# Patient Record
Sex: Female | Born: 1965
Health system: Southern US, Community
[De-identification: ages and names within clinical notes are randomized; demographics above are authoritative.]

## PROBLEM LIST (undated history)

## (undated) DIAGNOSIS — E119 Type 2 diabetes mellitus without complications: Secondary | ICD-10-CM

## (undated) DIAGNOSIS — F419 Anxiety disorder, unspecified: Secondary | ICD-10-CM

## (undated) DIAGNOSIS — I10 Essential (primary) hypertension: Secondary | ICD-10-CM

## (undated) DIAGNOSIS — K219 Gastro-esophageal reflux disease without esophagitis: Secondary | ICD-10-CM

## (undated) HISTORY — PX: OTHER SURGICAL HISTORY: SHX169

---

## 2015-08-15 ENCOUNTER — Encounter (HOSPITAL_COMMUNITY): Payer: Self-pay

## 2015-08-15 ENCOUNTER — Emergency Department (HOSPITAL_COMMUNITY): Payer: Medicaid Other

## 2015-08-15 DIAGNOSIS — F419 Anxiety disorder, unspecified: Secondary | ICD-10-CM | POA: Insufficient documentation

## 2015-08-15 DIAGNOSIS — K219 Gastro-esophageal reflux disease without esophagitis: Secondary | ICD-10-CM | POA: Diagnosis not present

## 2015-08-15 DIAGNOSIS — R0602 Shortness of breath: Secondary | ICD-10-CM | POA: Diagnosis not present

## 2015-08-15 DIAGNOSIS — E1165 Type 2 diabetes mellitus with hyperglycemia: Secondary | ICD-10-CM | POA: Insufficient documentation

## 2015-08-15 DIAGNOSIS — R079 Chest pain, unspecified: Principal | ICD-10-CM | POA: Insufficient documentation

## 2015-08-15 DIAGNOSIS — I10 Essential (primary) hypertension: Secondary | ICD-10-CM | POA: Insufficient documentation

## 2015-08-15 LAB — CBC
HCT: 31.8 % — ABNORMAL LOW (ref 36.0–46.0)
Hemoglobin: 10.6 g/dL — ABNORMAL LOW (ref 12.0–15.0)
MCH: 25.9 pg — ABNORMAL LOW (ref 26.0–34.0)
MCHC: 33.3 g/dL (ref 30.0–36.0)
MCV: 77.8 fL — ABNORMAL LOW (ref 78.0–100.0)
PLATELETS: 282 10*3/uL (ref 150–400)
RBC: 4.09 MIL/uL (ref 3.87–5.11)
RDW: 13.7 % (ref 11.5–15.5)
WBC: 6.4 10*3/uL (ref 4.0–10.5)

## 2015-08-15 LAB — I-STAT TROPONIN, ED: TROPONIN I, POC: 0 ng/mL (ref 0.00–0.08)

## 2015-08-15 NOTE — ED Notes (Signed)
Pt states she chest pain has been bothering her a couple days now and has been having SOB with it. It feels tight and also feels like heart burn. Pt is diabetic and goes to [facility]. Hasn't had meds in a couple weeks but usually goes to [facility] for meds.

## 2015-08-16 ENCOUNTER — Observation Stay (HOSPITAL_COMMUNITY)
Admission: EM | Admit: 2015-08-16 | Discharge: 2015-08-16 | Disposition: A | Discharge status: Home / Self Care | Payer: Medicaid Other | Attending: Emergency Medicine | Admitting: Emergency Medicine

## 2015-08-16 DIAGNOSIS — R739 Hyperglycemia, unspecified: Secondary | ICD-10-CM

## 2015-08-16 DIAGNOSIS — R079 Chest pain, unspecified: Secondary | ICD-10-CM

## 2015-08-16 DIAGNOSIS — R55 Syncope and collapse: Secondary | ICD-10-CM | POA: Diagnosis present

## 2015-08-16 HISTORY — DX: Type 2 diabetes mellitus without complications: E11.9

## 2015-08-16 HISTORY — DX: Gastro-esophageal reflux disease without esophagitis: K21.9

## 2015-08-16 HISTORY — DX: Anxiety disorder, unspecified: F41.9

## 2015-08-16 HISTORY — DX: Essential (primary) hypertension: I10

## 2015-08-16 LAB — BASIC METABOLIC PANEL
Anion gap: 10 (ref 5–15)
BUN: 16 mg/dL (ref 6–20)
CALCIUM: 9.4 mg/dL (ref 8.9–10.3)
CO2: 28 mmol/L (ref 22–32)
CREATININE: 1.22 mg/dL — AB (ref 0.44–1.00)
Chloride: 92 mmol/L — ABNORMAL LOW (ref 101–111)
GFR calc Af Amer: >60 mL/min (ref >60)
GFR, EST NON AFRICAN AMERICAN: 52 mL/min — AB (ref >60)
Glucose, Bld: 484 mg/dL — ABNORMAL HIGH (ref 65–99)
POTASSIUM: 4.5 mmol/L (ref 3.5–5.1)
SODIUM: 130 mmol/L — AB (ref 135–145)

## 2015-08-16 LAB — I-STAT TROPONIN, ED: Troponin i, poc: 0 ng/mL (ref 0.00–0.08)

## 2015-08-16 LAB — CBG MONITORING, ED: GLUCOSE-CAPILLARY: 311 mg/dL — AB (ref 65–99)

## 2015-08-16 MED ORDER — SERTRALINE HCL 50 MG PO TABS: 150.0000 mg | ORAL_TABLET | Freq: Every day | ORAL | Status: AC

## 2015-08-16 MED ORDER — SODIUM CHLORIDE 0.9 % IV BOLUS (SEPSIS)
1000.0000 mL | Freq: Once | INTRAVENOUS | Status: AC
Start: 2015-08-16 — End: 2015-08-16
  Administered 2015-08-16: 1000 mL via INTRAVENOUS

## 2015-08-16 MED ORDER — INSULIN ASPART 100 UNIT/ML ~~LOC~~ SOLN
10.0000 [IU] | Freq: Once | SUBCUTANEOUS | Status: AC
  Administered 2015-08-16: 10 [IU] via SUBCUTANEOUS
  Filled 2015-08-16: qty 1

## 2015-08-16 MED ORDER — LISINOPRIL-HYDROCHLOROTHIAZIDE 20-25 MG PO TABS: 1.0000 | ORAL_TABLET | Freq: Every day | ORAL | Status: DC

## 2015-08-16 MED ORDER — OMEPRAZOLE 20 MG PO CPDR: 20.0000 mg | DELAYED_RELEASE_CAPSULE | Freq: Every day | ORAL | Status: AC

## 2015-08-16 MED ORDER — METFORMIN HCL 500 MG PO TABS: 1000.0000 mg | ORAL_TABLET | Freq: Two times a day (BID) | ORAL | Status: DC

## 2015-08-16 NOTE — Discharge Instructions (Signed)
Nonspecific Chest Pain  Chest pain can be caused by many different conditions. There is always a chance that your pain could be related to something serious, such as a heart attack or a blood clot in your lungs. Chest pain can also be caused by conditions that are not life-threatening. If you have chest pain, it is very important to follow up with your health care provider. CAUSES  Chest pain can be caused by:  Heartburn.  Pneumonia or bronchitis.  Anxiety or stress.  Inflammation around your heart (pericarditis) or lung (pleuritis or pleurisy).  A blood clot in your lung.  A collapsed lung (pneumothorax). It can develop suddenly on its own (spontaneous pneumothorax) or from trauma to the chest.  Shingles infection (varicella-zoster virus).  Heart attack.  Damage to the bones, muscles, and cartilage that make up your chest wall. This can include:  Bruised bones due to injury.  Strained muscles or cartilage due to frequent or repeated coughing or overwork.  Fracture to one or more ribs.  Sore cartilage due to inflammation (costochondritis). RISK FACTORS  Risk factors for chest pain may include:  Activities that increase your risk for trauma or injury to your chest.  Respiratory infections or conditions that cause frequent coughing.  Medical conditions or overeating that can cause heartburn.  Heart disease or family history of heart disease.  Conditions or health behaviors that increase your risk of developing a blood clot.  Having had chicken pox (varicella zoster). SIGNS AND SYMPTOMS Chest pain can feel like:  Burning or tingling on the surface of your chest or deep in your chest.  Crushing, pressure, aching, or squeezing pain.  Dull or sharp pain that is worse when you move, cough, or take a deep breath.  Pain that is also felt in your back, neck, shoulder, or arm, or pain that spreads to any of these areas. Your chest pain may come and go, or it may stay  constant. DIAGNOSIS Lab tests or other studies may be needed to find the cause of your pain. Your health care provider may have you take a test called an ambulatory ECG (electrocardiogram). An ECG records your heartbeat patterns at the time the test is performed. You may also have other tests, such as:  Transthoracic echocardiogram (TTE). During echocardiography, sound waves are used to create a picture of all of the heart structures and to look at how blood flows through your heart.  Transesophageal echocardiogram (TEE).This is a more advanced imaging test that obtains images from inside your body. It allows your health care provider to see your heart in finer detail.  Cardiac monitoring. This allows your health care provider to monitor your heart rate and rhythm in real time.  Holter monitor. This is a portable device that records your heartbeat and can help to diagnose abnormal heartbeats. It allows your health care provider to track your heart activity for several days, if needed.  Stress tests. These can be done through exercise or by taking medicine that makes your heart beat more quickly.  Blood tests.  Imaging tests. TREATMENT  Your treatment depends on what is causing your chest pain. Treatment may include:  Medicines. These may include:  Acid blockers for heartburn.  Anti-inflammatory medicine.  Pain medicine for inflammatory conditions.  Antibiotic medicine, if an infection is present.  Medicines to dissolve blood clots.  Medicines to treat coronary artery disease.  Supportive care for conditions that do not require medicines. This may include:  Resting.  Applying heat  or cold packs to injured areas.  Limiting activities until pain decreases. HOME CARE INSTRUCTIONS  If you were prescribed an antibiotic medicine, finish it all even if you start to feel better.  Avoid any activities that bring on chest pain.  Do not use any tobacco products, including  cigarettes, chewing tobacco, or electronic cigarettes. If you need help quitting, ask your health care provider.  Do not drink alcohol.  Take medicines only as directed by your health care provider.  Keep all follow-up visits as directed by your health care provider. This is important. This includes any further testing if your chest pain does not go away.  If heartburn is the cause for your chest pain, you may be told to keep your head raised (elevated) while sleeping. This reduces the chance that acid will go from your stomach into your esophagus.  Make lifestyle changes as directed by your health care provider. These may include:  Getting regular exercise. Ask your health care provider to suggest some activities that are safe for you.  Eating a heart-healthy diet. A registered dietitian can help you to learn healthy eating options.  Maintaining a healthy weight.  Managing diabetes, if necessary.  Reducing stress. SEEK MEDICAL CARE IF:  Your chest pain does not go away after treatment.  You have a rash with blisters on your chest.  You have a fever. SEEK IMMEDIATE MEDICAL CARE IF:   Your chest pain is worse.  You have an increasing cough, or you cough up blood.  You have severe abdominal pain.  You have severe weakness.  You faint.  You have chills.  You have sudden, unexplained chest discomfort.  You have sudden, unexplained discomfort in your arms, back, neck, or jaw.  You have shortness of breath at any time.  You suddenly start to sweat, or your skin gets clammy.  You feel nauseous or you vomit.  You suddenly feel light-headed or dizzy.  Your heart begins to beat quickly, or it feels like it is skipping beats. These symptoms may represent a serious problem that is an emergency. Do not wait to see if the symptoms will go away. Get medical help right away. Call your local emergency services (911 in the [place] Do not drive yourself to the hospital.   This  information is not intended to replace advice given to you by your health care provider. Make sure you discuss any questions you have with your health care provider.   Document Released: 04/15/2005 Document Revised: 07/27/2014 Document Reviewed: 02/09/2014 Elsevier Interactive Patient Education 2016 [employer].  Hyperglycemia Hyperglycemia occurs when the glucose (sugar) in your blood is too high. Hyperglycemia can happen for many reasons, but it most often happens to people who do not know they have diabetes or are not managing their diabetes properly.  CAUSES  Whether you have diabetes or not, there are other causes of hyperglycemia. Hyperglycemia can occur when you have diabetes, but it can also occur in other situations that you might not be as aware of, such as: Diabetes  If you have diabetes and are having problems controlling your blood glucose, hyperglycemia could occur because of some of the following reasons:  Not following your meal plan.  Not taking your diabetes medications or not taking it properly.  Exercising less or doing less activity than you normally do.  Being sick. Pre-diabetes  This cannot be ignored. Before people develop Type 2 diabetes, they almost always have "pre-diabetes." This is when your blood glucose levels  are higher than normal, but not yet high enough to be diagnosed as diabetes. Research has shown that some long-term damage to the body, especially the heart and circulatory system, may already be occurring during pre-diabetes. If you take action to manage your blood glucose when you have pre-diabetes, you may delay or prevent Type 2 diabetes from developing. Stress  If you have diabetes, you may be "diet" controlled or on oral medications or insulin to control your diabetes. However, you may find that your blood glucose is higher than usual in the hospital whether you have diabetes or not. This is often referred to as "stress hyperglycemia." Stress can  elevate your blood glucose. This happens because of hormones put out by the body during times of stress. If stress has been the cause of your high blood glucose, it can be followed regularly by your caregiver. That way he/she can make sure your hyperglycemia does not continue to get worse or progress to diabetes. Steroids  Steroids are medications that act on the infection fighting system (immune system) to block inflammation or infection. One side effect can be a rise in blood glucose. Most people can produce enough extra insulin to allow for this rise, but for those who cannot, steroids make blood glucose levels go even higher. It is not unusual for steroid treatments to "uncover" diabetes that is developing. It is not always possible to determine if the hyperglycemia will go away after the steroids are stopped. A special blood test called an A1c is sometimes done to determine if your blood glucose was elevated before the steroids were started. SYMPTOMS  Thirsty.  Frequent urination.  Dry mouth.  Blurred vision.  Tired or fatigue.  Weakness.  Sleepy.  Tingling in feet or leg. DIAGNOSIS  Diagnosis is made by monitoring blood glucose in one or all of the following ways:  A1c test. This is a chemical found in your blood.  Fingerstick blood glucose monitoring.  Laboratory results. TREATMENT  First, knowing the cause of the hyperglycemia is important before the hyperglycemia can be treated. Treatment may include, but is not be limited to:  Education.  Change or adjustment in medications.  Change or adjustment in meal plan.  Treatment for an illness, infection, etc.  More frequent blood glucose monitoring.  Change in exercise plan.  Decreasing or stopping steroids.  Lifestyle changes. HOME CARE INSTRUCTIONS   Test your blood glucose as directed.  Exercise regularly. Your caregiver will give you instructions about exercise. Pre-diabetes or diabetes which comes on with  stress is helped by exercising.  Eat wholesome, balanced meals. Eat often and at regular, fixed times. Your caregiver or nutritionist will give you a meal plan to guide your sugar intake.  Being at an ideal weight is important. If needed, losing as little as 10 to 15 pounds may help improve blood glucose levels. SEEK MEDICAL CARE IF:   You have questions about medicine, activity, or diet.  You continue to have symptoms (problems such as increased thirst, urination, or weight gain). SEEK IMMEDIATE MEDICAL CARE IF:   You are vomiting or have diarrhea.  Your breath smells fruity.  You are breathing faster or slower.  You are very sleepy or incoherent.  You have numbness, tingling, or pain in your feet or hands.  You have chest pain.  Your symptoms get worse even though you have been following your caregiver's orders.  If you have any other questions or concerns.   This information is not intended to replace  advice given to you by your health care provider. Make sure you discuss any questions you have with your health care provider.   Document Released: 12/30/2000 Document Revised: 09/28/2011 Document Reviewed: 03/12/2015 Elsevier Interactive Patient Education [employer].

## 2015-08-16 NOTE — ED Provider Notes (Signed)
CSN: [id number]     Arrival date & time 08/15/15  2317 History   By signing my name below, I, Michele Shaw, attest that this documentation has been prepared under the direction and in the presence of Michele Greek, MD . Electronically Signed: Evelene Shaw, Scribe. 08/16/2015. 1:48 AM.    Chief Complaint  Patient presents with  . Chest Pain    The history is provided by the patient. No language interpreter was used.   HPI Comments:  Michele Shaw is a 50 y.o. female with a history of HTN, DM, GERD, and anxiety, who presents to the Emergency Department complaining of intermittent CP x a few days. She notes the episodes last for ~ 15 minutes before resolving. She reports associated SOB. She also notes left shoulder pain x months which is worse a night. Pt states she has been very stressed recently. No alleviating factors noted. She also notes she has been out of DM and HTN meds x 2 weeks.   Past Medical History  Diagnosis Date  . Hypertension   . Diabetes mellitus without complication ([place])   . Anxiety   . GERD (gastroesophageal reflux disease)    Past Surgical History  Procedure Laterality Date  . Cesarean section     No family history on file. Social History  Substance Use Topics  . Smoking status: Never Smoker   . Smokeless tobacco: None  . Alcohol Use: No   OB History    No data available     Review of Systems  Constitutional: Negative for fever.  Respiratory: Positive for shortness of breath.   Cardiovascular: Positive for chest pain.  Musculoskeletal: Positive for myalgias and arthralgias.       Left shoulder   All other systems reviewed and are negative.   Allergies  Aspirin  Home Medications   Prior to Admission medications   Not on File   BP 152/88 mmHg  Pulse 74  Temp(Src) 97.4 F (36.3 C) (Oral)  Resp 18  Ht 5\' 7"  (1.702 m)  Wt 180 lb (81.647 kg)  BMI 28.19 kg/m2  SpO2 98%  LMP 06/24/2015 Physical Exam  Constitutional: She is  oriented to person, place, and time. She appears well-developed and well-nourished. No distress.  HENT:  Head: Normocephalic and atraumatic.  Right Ear: Hearing normal.  Left Ear: Hearing normal.  Nose: Nose normal.  Mouth/Throat: Oropharynx is clear and moist and mucous membranes are normal.  Eyes: Conjunctivae and EOM are normal. Pupils are equal, round, and reactive to light.  Neck: Normal range of motion. Neck supple.  Cardiovascular: Regular rhythm, S1 normal and S2 normal.  Exam reveals no gallop and no friction rub.   No murmur heard. Pulmonary/Chest: Effort normal and breath sounds normal. No respiratory distress. She exhibits no tenderness.  Abdominal: Soft. Normal appearance and bowel sounds are normal. There is no hepatosplenomegaly. There is no tenderness. There is no rebound, no guarding, no tenderness at McBurney's point and negative Murphy's sign. No hernia.  Musculoskeletal: Normal range of motion.  Tenderness to left anterior shoulder; painful ROM  Neurological: She is alert and oriented to person, place, and time. She has normal strength. No cranial nerve deficit or sensory deficit. Coordination normal. GCS eye subscore is 4. GCS verbal subscore is 5. GCS motor subscore is 6.  Skin: Skin is warm, dry and intact. No rash noted. No cyanosis.  Psychiatric: She has a normal mood and affect. Her speech is normal and behavior is normal. Thought content normal.  Nursing note and vitals reviewed.   ED Course  Procedures   DIAGNOSTIC STUDIES:  Oxygen Saturation is 98% on RA, normal by my interpretation.    COORDINATION OF CARE:  1:43 AM Pt updated with partial results. Will order another trop, IV fluids and discharge with medication refills.  Discussed treatment plan with pt at bedside and pt agreed to plan.  Labs Review Labs Reviewed  BASIC METABOLIC PANEL - Abnormal; Notable for the following:    Sodium 130 (*)    Chloride 92 (*)    Glucose, Bld 484 (*)    Creatinine,  Ser 1.22 (*)    GFR calc non Af Amer 52 (*)    All other components within normal limits  CBC - Abnormal; Notable for the following:    Hemoglobin 10.6 (*)    HCT 31.8 (*)    MCV 77.8 (*)    MCH 25.9 (*)    All other components within normal limits  I-STAT TROPOININ, ED  CBG MONITORING, ED    Imaging Review Dg Chest 2 View  08/15/2015  CLINICAL DATA:  Acute onset of midline chest pain for 3 days, and chronic left arm pain. Initial encounter. EXAM: CHEST  2 VIEW COMPARISON:  None. FINDINGS: The lungs are well-aerated and clear. There is no evidence of focal opacification, pleural effusion or pneumothorax. The heart is normal in size; the mediastinal contour is within normal limits. No acute osseous abnormalities are seen. IMPRESSION: No acute cardiopulmonary process seen. Electronically Signed   By: Garald Balding M.D.   On: 08/15/2015 23:44   I have personally reviewed and evaluated these images and lab results as part of my medical decision-making.   EKG Interpretation   Date/Time:  Thursday August 15 2015 23:27:26 EST Ventricular Rate:  74 PR Interval:  150 QRS Duration: 82 QT Interval:  406 QTC Calculation: 450 R Axis:   -20 Text Interpretation:  Normal sinus rhythm Normal ECG Confirmed by POLLINA   MD, CHRISTOPHER [id number]) on 08/16/2015 1:37:56 AM      MDM   Final diagnoses:  None  chest pain hyperglycemia  Presents to the emergency department for evaluation of chest pain. She has been experiencing pain in the center of her chest for the last couple of days. She is occasionally short of breath with it. She thinks that it feels like heartburn, but she has a history of hypertension and diabetes and has been out of her medications for 2 weeks. Patient is not hypoxic, not tachycardic. PERC negative. She has chronic risk factors including hypertension and diabetes, is not a smoker. No family history. Initial EKG was normal. Troponin negative. Patient monitored in the ER and a  second troponin 3 hours after the first was still negative. It is felt that she is low risk and does not require hospitalization for further cardiac evaluation, can follow-up as an outpatient. Blood sugar was treated with a combination of IV fluids and insulin. Her medications will be refilled.  I personally performed the services described in this documentation, which was scribed in my presence. The recorded information has been reviewed and is accurate.    Michele Greek, MD 08/16/15 [phone]

## 2015-08-17 ENCOUNTER — Encounter (HOSPITAL_COMMUNITY): Payer: Self-pay | Admitting: *Deleted

## 2015-08-17 ENCOUNTER — Emergency Department (HOSPITAL_COMMUNITY)
Admission: EM | Admit: 2015-08-17 | Discharge: 2015-08-18 | Disposition: A | Discharge status: Home / Self Care | Payer: Medicaid Other | Attending: Emergency Medicine | Admitting: Emergency Medicine

## 2015-08-17 DIAGNOSIS — I10 Essential (primary) hypertension: Secondary | ICD-10-CM | POA: Diagnosis not present

## 2015-08-17 DIAGNOSIS — Z7984 Long term (current) use of oral hypoglycemic drugs: Secondary | ICD-10-CM | POA: Insufficient documentation

## 2015-08-17 DIAGNOSIS — Z79899 Other long term (current) drug therapy: Secondary | ICD-10-CM | POA: Diagnosis not present

## 2015-08-17 DIAGNOSIS — B349 Viral infection, unspecified: Secondary | ICD-10-CM

## 2015-08-17 DIAGNOSIS — F419 Anxiety disorder, unspecified: Secondary | ICD-10-CM | POA: Insufficient documentation

## 2015-08-17 DIAGNOSIS — R52 Pain, unspecified: Secondary | ICD-10-CM | POA: Diagnosis present

## 2015-08-17 DIAGNOSIS — R739 Hyperglycemia, unspecified: Secondary | ICD-10-CM

## 2015-08-17 DIAGNOSIS — K219 Gastro-esophageal reflux disease without esophagitis: Secondary | ICD-10-CM | POA: Insufficient documentation

## 2015-08-17 DIAGNOSIS — E1165 Type 2 diabetes mellitus with hyperglycemia: Secondary | ICD-10-CM | POA: Diagnosis not present

## 2015-08-17 LAB — URINALYSIS, ROUTINE W REFLEX MICROSCOPIC
Bilirubin Urine: NEGATIVE
Hgb urine dipstick: NEGATIVE
Ketones, ur: NEGATIVE mg/dL
LEUKOCYTES UA: NEGATIVE
NITRITE: NEGATIVE
PH: 5 (ref 5.0–8.0)
Protein, ur: NEGATIVE mg/dL
SPECIFIC GRAVITY, URINE: 1.027 (ref 1.005–1.030)

## 2015-08-17 LAB — I-STAT CHEM 8, ED
BUN: 13 mg/dL (ref 6–20)
CREATININE: 0.8 mg/dL (ref 0.44–1.00)
Calcium, Ion: 1.14 mmol/L (ref 1.12–1.23)
Chloride: 97 mmol/L — ABNORMAL LOW (ref 101–111)
GLUCOSE: 345 mg/dL — AB (ref 65–99)
HEMATOCRIT: 33 % — AB (ref 36.0–46.0)
HEMOGLOBIN: 11.2 g/dL — AB (ref 12.0–15.0)
POTASSIUM: 3.8 mmol/L (ref 3.5–5.1)
Sodium: 135 mmol/L (ref 135–145)
TCO2: 24 mmol/L (ref 0–100)

## 2015-08-17 LAB — CBC WITH DIFFERENTIAL/PLATELET
BASOS PCT: 0 %
Basophils Absolute: 0 10*3/uL (ref 0.0–0.1)
Eosinophils Absolute: 0.2 10*3/uL (ref 0.0–0.7)
Eosinophils Relative: 3 %
HEMATOCRIT: 29.4 % — AB (ref 36.0–46.0)
HEMOGLOBIN: 9.7 g/dL — AB (ref 12.0–15.0)
LYMPHS PCT: 36 %
Lymphs Abs: 2.3 10*3/uL (ref 0.7–4.0)
MCH: 25.7 pg — ABNORMAL LOW (ref 26.0–34.0)
MCHC: 33 g/dL (ref 30.0–36.0)
MCV: 78 fL (ref 78.0–100.0)
MONO ABS: 0.4 10*3/uL (ref 0.1–1.0)
MONOS PCT: 6 %
NEUTROS ABS: 3.5 10*3/uL (ref 1.7–7.7)
NEUTROS PCT: 55 %
Platelets: 250 10*3/uL (ref 150–400)
RBC: 3.77 MIL/uL — ABNORMAL LOW (ref 3.87–5.11)
RDW: 13.8 % (ref 11.5–15.5)
WBC: 6.4 10*3/uL (ref 4.0–10.5)

## 2015-08-17 LAB — URINE MICROSCOPIC-ADD ON

## 2015-08-17 MED ORDER — ACETAMINOPHEN 325 MG PO TABS
650.0000 mg | ORAL_TABLET | Freq: Once | ORAL | Status: AC
  Administered 2015-08-17: 650 mg via ORAL
  Filled 2015-08-17: qty 2

## 2015-08-17 NOTE — ED Provider Notes (Signed)
CSN: [id number]     Arrival date & time 08/17/15  2217 History   First MD Initiated Contact with Patient 08/17/15 2242     Chief Complaint  Patient presents with  . Generalized Body Aches     (Consider location/radiation/quality/duration/timing/severity/associated sxs/prior Treatment) HPI Comments: This 50 year old female who was seen 2 days ago for chest discomfort that she had for 2-3 days.  She states she was discharge home is also noted that she had slight hypertension and elevated blood sugar.  She states she is known hypertensive and diabetic, but had been without medication for several months due to funding issues.  She has no primary care physician locally, although she's been in [place] for the past 7 months.  She returns to the emergency room tonight complaining of myalgias continued chest discomfort.  She states she vomited one time yesterday.  She still feels slightly, nauseated without any episodes of vomiting.  Reports chills but no known fever  The history is provided by the patient.    Past Medical History  Diagnosis Date  . Hypertension   . Diabetes mellitus without complication ([place])   . Anxiety   . GERD (gastroesophageal reflux disease)    Past Surgical History  Procedure Laterality Date  . Cesarean section     No family history on file. Social History  Substance Use Topics  . Smoking status: Never Smoker   . Smokeless tobacco: None  . Alcohol Use: No   OB History    No data available     Review of Systems  Constitutional: Positive for chills. Negative for fever.  Respiratory: Negative for shortness of breath.   Cardiovascular: Positive for chest pain.  Gastrointestinal: Positive for vomiting and abdominal pain. Negative for diarrhea and constipation.  Genitourinary: Negative for dysuria.  Musculoskeletal: Positive for myalgias.  Skin: Negative for pallor and rash.  All other systems reviewed and are negative.     Allergies  Aspirin  Home  Medications   Prior to Admission medications   Medication Sig Start Date End Date Taking? Authorizing Provider  acetaminophen (TYLENOL) 500 MG tablet Take 1,000 mg by mouth every 6 (six) hours as needed for headache.   Yes Historical Provider, MD  lisinopril-hydrochlorothiazide (PRINZIDE,ZESTORETIC) 20-25 MG tablet Take 1 tablet by mouth daily. 08/16/15  Yes [name], MD  metFORMIN (GLUCOPHAGE) 500 MG tablet Take 2 tablets (1,000 mg total) by mouth 2 (two) times daily with a meal. 08/16/15  Yes [name], MD  omeprazole (PRILOSEC) 20 MG capsule Take 1 capsule (20 mg total) by mouth daily. 08/16/15  Yes [name], MD  sertraline (ZOLOFT) 50 MG tablet Take 3 tablets (150 mg total) by mouth daily. 08/16/15  Yes [name], MD   BP 135/78 mmHg  Pulse 75  Temp(Src) 98.5 F (36.9 C) (Oral)  Resp 18  Wt 81.784 kg  SpO2 100%  LMP 06/24/2015 Physical Exam  Constitutional: She is oriented to person, place, and time. She appears well-developed and well-nourished. No distress.  HENT:  Head: Normocephalic.  Eyes: Pupils are equal, round, and reactive to light.  Neck: Normal range of motion.  Cardiovascular: Normal rate and regular rhythm.   Pulmonary/Chest: Effort normal and breath sounds normal.  Abdominal: Soft. She exhibits no distension. There is no tenderness.  Musculoskeletal: Normal range of motion. She exhibits no edema or tenderness.  Neurological: She is alert and oriented to person, place, and time.  Skin: She is not diaphoretic.  Nursing note  and vitals reviewed.   ED Course  Procedures (including critical care time) Labs Review Labs Reviewed  URINALYSIS, ROUTINE W REFLEX MICROSCOPIC (NOT AT [facility]) - Abnormal; Notable for the following:    Glucose, UA >1000 (*)    All other components within normal limits  CBC WITH DIFFERENTIAL/PLATELET - Abnormal; Notable for the following:    RBC 3.77 (*)    Hemoglobin 9.7 (*)    HCT 29.4 (*)     MCH 25.7 (*)    All other components within normal limits  URINE MICROSCOPIC-ADD ON - Abnormal; Notable for the following:    Squamous Epithelial / LPF 0-5 (*)    Bacteria, UA RARE (*)    All other components within normal limits  I-STAT CHEM 8, ED - Abnormal; Notable for the following:    Chloride 97 (*)    Glucose, Bld 345 (*)    Hemoglobin 11.2 (*)    HCT 33.0 (*)    All other components within normal limits    Imaging Review No results found. I have personally reviewed and evaluated these images and lab results as part of my medical decision-making.   EKG Interpretation None     Patient states she took her dose of Metrofmin yesterday morning which was the first dose .Will give additional dose prior to DC Stressed improtant of PCP as well as regular medication monitoring her blood sugars etc.  MDM   Final diagnoses:  Viral illness  Hyperglycemia         Junius Creamer, NP 08/18/15 [place], MD 08/18/15 [phone]

## 2015-08-17 NOTE — ED Notes (Signed)
The pt has had generalized body aches  For 2-3 days.  Vomited yesterday.  lmp dec 6th

## 2015-08-18 MED ORDER — METFORMIN HCL 500 MG PO TABS
500.0000 mg | ORAL_TABLET | Freq: Once | ORAL | Status: AC
  Administered 2015-08-18: 500 mg via ORAL
  Filled 2015-08-18: qty 1

## 2015-08-18 NOTE — Discharge Instructions (Signed)
Blood Glucose Monitoring, Adult °Monitoring your blood glucose (also know as blood sugar) helps you to manage your diabetes. It also helps you and your health care provider monitor your diabetes and determine how well your treatment plan is working. °WHY SHOULD YOU MONITOR YOUR BLOOD GLUCOSE? °· It can help you understand how food, exercise, and medicine affect your blood glucose. °· It allows you to know what your blood glucose is at any given moment. You can quickly tell if you are having low blood glucose (hypoglycemia) or high blood glucose (hyperglycemia). °· It can help you and your health care provider know how to adjust your medicines. °· It can help you understand how to manage an illness or adjust medicine for exercise. °WHEN SHOULD YOU TEST? °Your health care provider will help you decide how often you should check your blood glucose. This may depend on the type of diabetes you have, your diabetes control, or the types of medicines you are taking. Be sure to write down all of your blood glucose readings so that this information can be reviewed with your health care provider. See below for examples of testing times that your health care provider may suggest. °Type 1 Diabetes °· Test at least 2 times per day if your diabetes is well controlled, if you are using an insulin pump, or if you perform multiple daily injections. °· If your diabetes is not well controlled or if you are sick, you may need to test more often. °· It is a good idea to also test: °¨ Before every insulin injection. °¨ Before and after exercise. °¨ Between meals and 2 hours after a meal. °¨ Occasionally between 2:00 a.m. and 3:00 a.m. °Type 2 Diabetes °· If you are taking insulin, test at least 2 times per day. However, it is best to test before every insulin injection. °· If you take medicines by mouth (orally), test 2 times a day. °· If you are on a controlled diet, test once a day. °· If your diabetes is not well controlled or if you  are sick, you may need to monitor more often. °HOW TO MONITOR YOUR BLOOD GLUCOSE °Supplies Needed °· Blood glucose meter. °· Test strips for your meter. Each meter has its own strips. You must use the strips that go with your own meter. °· A pricking needle (lancet). °· A device that holds the lancet (lancing device). °· A journal or log book to write down your results. °Procedure °· Wash your hands with soap and water. Alcohol is not preferred. °· Prick the side of your finger (not the tip) with the lancet. °· Gently milk the finger until a small drop of blood appears. °· Follow the instructions that come with your meter for inserting the test strip, applying blood to the strip, and using your blood glucose meter. °Other Areas to Get Blood for Testing °Some meters allow you to use other areas of your body (other than your finger) to test your blood. These areas are called alternative sites. The most common alternative sites are: °· The forearm. °· The thigh. °· The back area of the lower leg. °· The palm of the hand. °The blood flow in these areas is slower. Therefore, the blood glucose values you get may be delayed, and the numbers are different from what you would get from your fingers. Do not use alternative sites if you think you are having hypoglycemia. Your reading will not be accurate. Always use a finger if you are   having hypoglycemia. Also, if you cannot feel your lows (hypoglycemia unawareness), always use your fingers for your blood glucose checks. ADDITIONAL TIPS FOR GLUCOSE MONITORING  Do not reuse lancets.  Always carry your supplies with you.  All blood glucose meters have a 24-hour "hotline" number to call if you have questions or need help.  Adjust (calibrate) your blood glucose meter with a control solution after finishing a few boxes of strips. BLOOD GLUCOSE RECORD KEEPING It is a good idea to keep a daily record or log of your blood glucose readings. Most glucose meters, if not all,  keep your glucose records stored in the meter. Some meters come with the ability to download your records to your home computer. Keeping a record of your blood glucose readings is especially helpful if you are wanting to look for patterns. Make notes to go along with the blood glucose readings because you might forget what happened at that exact time. Keeping good records helps you and your health care provider to work together to achieve good diabetes management.    This information is not intended to replace advice given to you by your health care provider. Make sure you discuss any questions you have with your health care provider.   Document Released: 07/09/2003 Document Revised: 07/27/2014 Document Reviewed: 11/28/2012 Elsevier Interactive Patient Education 2016 [employer].  Emergency Department Resource Guide 1) Find a Doctor and Pay Out of Pocket Although you won't have to find out who is covered by your insurance plan, it is a good idea to ask around and get recommendations. You will then need to call the office and see if the doctor you have chosen will accept you as a new patient and what types of options they offer for patients who are self-pay. Some doctors offer discounts or will set up payment plans for their patients who do not have insurance, but you will need to ask so you aren't surprised when you get to your appointment.  2) Contact Your Local Health Department Not all health departments have doctors that can see patients for sick visits, but many do, so it is worth a call to see if yours does. If you don't know where your local health department is, you can check in your phone book. The CDC also has a tool to help you locate your state's health department, and many state websites also have listings of all of their local health departments.  3) Find a [facility] If your illness is not likely to be very severe or complicated, you may want to try a walk in clinic. These are  popping up all over the country in pharmacies, drugstores, and shopping centers. They're usually staffed by nurse practitioners or physician assistants that have been trained to treat common illnesses and complaints. They're usually fairly quick and inexpensive. However, if you have serious medical issues or chronic medical problems, these are probably not your best option.  No Primary Care Doctor: - Call Health Connect at  [phone] - they can help you locate a primary care doctor that  accepts your insurance, provides certain services, etc. - Physician Referral Service- [phone]  Chronic Pain Problems: Organization         Address  Phone   Notes  [facility]  [phone] Patients need to be referred by their primary care doctor.   Medication Assistance: Organization         Address  Phone   Notes  [facility] Medication  Assistance Program [address] [place], [place] [address] [phone] --Must be a resident of [facility] -- Must have NO insurance coverage whatsoever (no Medicaid/ Medicare, etc.) -- The pt. MUST have a primary care doctor that directs their care regularly and follows them in the community   [facility]ist  [phone]   [employer]  [phone]    Agencies that provide inexpensive medical care: Organization         Address  Phone   Notes  [place]  [phone]   [facility]    [phone]   [facility] [place], [place] [address] [phone]   [place] [address] [place] [phone]   Planned Parenthood    ([phone]   [facility]    [phone]   [place] and [place] [address], [place] Phone:  [phone], Fax:  [phone] Hours of Operation:  9 am - 6 pm, M-F.  Also accepts Medicaid/Medicare and self-pay.  [facility] [place], [address], [place] Phone: [phone], Fax: [phone]. Hours of Operation:  8:30 am - 5:30 pm, M-F.  Also accepts Medicaid and self-pay.  [facility] [place] [address], [place] Phone: [phone]   [place], [place], [place] [phone], Ext. [phone] Mondays & Thursdays: 7-9 AM.  First 15 patients are seen on a first come, first serve basis.    [place] Providers:  Organization         Address  Phone   Notes  [facility] [address], [place] [phone] Also accepts self-pay patients.  [facility] [id number] [place], [place]  [phone]   [place], [address], [place] [phone]   [facility] [address] [place] [phone]   Lucianne Lei [address], [place]   [phone] Only accepts [place] Access [place] patients after they have their name applied to their card.   Self-Pay (no insurance) in [facility]:  Organization         Address  Phone   Notes  Sickle Cell Patients, [facility] [place] [phone]   [facility] Urgent Care [place] [phone]   [facility] [place]  [place], [place], [place] [phone]   [facility]/Dr. Osei-Bonsu  [address] [place] or [address], [place] [phone] Phone number for both [place] and [place] locations is the same.  Urgent Medical and [facility] [address] [place] [phone]   [facility] [address], [place] or [address] [phone]   [facility] [address], [place] [phone], phone; [phone], fax Sees patients 1st and 3rd Saturday  of every month.  Must not qualify for public or private insurance (i.e. Medicaid, Medicare, [place] Health Choice, Veterans' Benefits)  Household income should be no more than 200% of the poverty level The clinic cannot treat you if you are pregnant or think you are pregnant  Sexually transmitted diseases are not treated at  the clinic.    Dental Care: Organization         Address  Phone  Notes  [facility] [place] [phone] Accepts children up to age 61 who are enrolled in [place] or [place]; pregnant women with a Medicaid card; and children who have applied for Medicaid or [place] Health Choice, but were declined, whose parents can pay a reduced fee at time of service.  [facility] Department of [facility]  [address], [place] [phone] Accepts children up to age 1 who are enrolled in [place] or [place]; pregnant women with a Medicaid card; and children who have applied for Medicaid or [place] Health Choice, but were declined, whose parents can pay a reduced fee at time of service.  [facility] Adult [facility] Access PROGRAM  [place] [phone] Patients are seen by appointment only. Walk-ins are not accepted. [place] will see patients 37 years of age and older. Monday - Tuesday (8am-5pm) Most Wednesdays (8:30-5pm) $30 per visit, cash only  [facility] Adult [facility] Access [facility]  [address], [facility] [phone] Patients are seen by appointment only. Walk-ins are not accepted. [place] will see patients 87 years of age and older. One Wednesday Evening (Monthly: Volunteer Based).  $30 per visit, cash only  [place]  [phone] for adults; Children under age 87, call Graduate Pediatric Dentistry at [phone]. Children aged 14-14, please call [phone] to request a pediatric application.   Dental services are provided in all areas of dental care including fillings, crowns and bridges, complete and partial dentures, implants, gum treatment, root canals, and extractions. Preventive care is also provided. Treatment is provided to both adults and children. Patients are selected via a lottery and there is often a waiting list.   [facility] [address], [place]  [phone] [email or web address]   [facility] [address] [place], [place] [phone], Ext. [phone] Second and Fourth Thursday of each month, opens at 6:30 AM; Clinic ends at 9 AM.  Patients are seen on a first-come first-served basis, and a limited number are seen during each clinic.   [facility]  [address] Hillard Danker [place], [place] [phone]   Eligibility Requirements You must have lived in [place], [place], or [place] counties for at least the last three months.   You cannot be eligible for state or federal sponsored [employer], including [employer], [place], or [employer].   You generally cannot be eligible for healthcare insurance through your employer.    How to apply: Eligibility screenings are held every Tuesday and Wednesday afternoon from 1:00 pm until 4:00 pm. You do not need an appointment for the interview!  [facility] [address], [place], [place]   [place]  [place] Department  [place]  [phone]    Behavioral Health Resources in the Community: Intensive Outpatient Programs Organization         Address  Phone  Notes  [place] [address], [place], [place] [phone]   [facility] Outpatient [address] [place], [place]   ADS: Alcohol & Drug Svcs [address] [place], [place]   [place]  [address]  [place], [place] or [phone]   Substance Abuse Resources Organization         Address  Phone  Notes  Alcohol and Drug Services  [phone]   [place]  [phone]   [facility] [place]   Chinita Pester  [phone]   Residential & Outpatient Substance Abuse Program  9513803114   Psychological Services Organization         Address  Phone  Notes  [facility] [place]  [place]  [phone]   [place] [address] [place] or [phone]    Mobile Crisis Teams Organization         Address  Phone  Notes  [facility], Mobile Crisis Care Unit  [phone]   [facility]  [address] [place], [place]   [place] [address], [place] [phone]    Self-Help/Support Groups Organization         Address  Phone             Notes  [facility] - variety of support groups  [place] Call for more information  Narcotics Anonymous (NA), [facility]aring [facility] [address], [employer] [place]  2 meetings at this location   Special educational needs teacher         Address  Phone  Notes  [facility] [place],    [place]  [phone]   [facility]  [address], [place] [id number], [place], [place]   [place], [place] [phone] Admissions: 8am-3pm M-F  Incentives Substance [place] [address]    [place], [place] [phone]   [facility] [address] [place], [place], [place]   [facility] [address],  [place], [place]   [facility] - Intensive Outpatient [address]een [address], [place], [place]   [facility] ([place].,  [place], [place]  [phone] or [phone]   [facility] (RTS) [address], [place], [place] Accepts Medicaid  [facility] [place] [address],  [place] [phone] Substance Abuse/Addiction Treatment   [facility] Organization         Address  Phone  Notes  [facility]  [phone]   Domenic Schwab, PhD [address] [place], [place]   [phone] or [phone]   [place], [place] [phone]   [facility] [address] [place], [place] [phone] Insurance/Medicaid/sponsorship through [facility] and Families [address] [place]                                    [place], [place] [phone] [place] [address], [place] [phone]    Dr. Adele Schilder  [phone]   [facility] Dept. 1) [address] [place] 2) [place] 3)  [place] [address], [place] [phone]  [phone]   [place] [phone] or [phone] (After Hours)

## 2015-08-18 NOTE — ED Notes (Signed)
Pt stable, ambulatory, states understanding of discharge instructions 

## 2015-11-30 ENCOUNTER — Emergency Department (HOSPITAL_COMMUNITY)
Admission: EM | Admit: 2015-11-30 | Discharge: 2015-11-30 | Disposition: A | Discharge status: Home / Self Care | Payer: Medicaid Other | Attending: Emergency Medicine | Admitting: Emergency Medicine

## 2015-11-30 ENCOUNTER — Encounter (HOSPITAL_COMMUNITY): Payer: Self-pay

## 2015-11-30 DIAGNOSIS — K219 Gastro-esophageal reflux disease without esophagitis: Secondary | ICD-10-CM | POA: Diagnosis not present

## 2015-11-30 DIAGNOSIS — R0989 Other specified symptoms and signs involving the circulatory and respiratory systems: Secondary | ICD-10-CM | POA: Diagnosis not present

## 2015-11-30 DIAGNOSIS — I10 Essential (primary) hypertension: Secondary | ICD-10-CM | POA: Diagnosis not present

## 2015-11-30 DIAGNOSIS — R0981 Nasal congestion: Secondary | ICD-10-CM | POA: Diagnosis not present

## 2015-11-30 DIAGNOSIS — Z760 Encounter for issue of repeat prescription: Secondary | ICD-10-CM | POA: Insufficient documentation

## 2015-11-30 DIAGNOSIS — Z79899 Other long term (current) drug therapy: Secondary | ICD-10-CM | POA: Diagnosis not present

## 2015-11-30 DIAGNOSIS — J029 Acute pharyngitis, unspecified: Secondary | ICD-10-CM | POA: Insufficient documentation

## 2015-11-30 DIAGNOSIS — R5383 Other fatigue: Secondary | ICD-10-CM | POA: Diagnosis not present

## 2015-11-30 DIAGNOSIS — F419 Anxiety disorder, unspecified: Secondary | ICD-10-CM | POA: Insufficient documentation

## 2015-11-30 DIAGNOSIS — M791 Myalgia: Secondary | ICD-10-CM | POA: Diagnosis not present

## 2015-11-30 DIAGNOSIS — E119 Type 2 diabetes mellitus without complications: Secondary | ICD-10-CM | POA: Insufficient documentation

## 2015-11-30 MED ORDER — METFORMIN HCL 500 MG PO TABS: 1000.0000 mg | ORAL_TABLET | Freq: Two times a day (BID) | ORAL | Status: DC

## 2015-11-30 MED ORDER — LISINOPRIL-HYDROCHLOROTHIAZIDE 20-25 MG PO TABS: 1.0000 | ORAL_TABLET | Freq: Every day | ORAL | Status: DC

## 2015-11-30 NOTE — Discharge Instructions (Signed)
Medicine Refill at the Emergency Department We have refilled your medicine today, but it is best for you to get refills through your primary health care provider's office. In the future, please plan ahead so you do not need to get refills from the emergency department. If the medicine we refilled was a maintenance medicine, you may have received only enough to get you by until you are able to see your regular health care provider.   This information is not intended to replace advice given to you by your health care provider. Make sure you discuss any questions you have with your health care provider.   Document Released: 10/23/2003 Document Revised: 07/27/2014 Document Reviewed: 10/13/2013 Elsevier Interactive Patient Education 2016 Elsevier Inc.  Upper Respiratory Infection, Adult Most upper respiratory infections (URIs) are caused by a virus. A URI affects the nose, throat, and upper air passages. The most common type of URI is often called "the common cold." HOME CARE   Take medicines only as told by your doctor.  Gargle warm saltwater or take cough drops to comfort your throat as told by your doctor.  Use a warm mist humidifier or inhale steam from a shower to increase air moisture. This may make it easier to breathe.  Drink enough fluid to keep your pee (urine) clear or pale yellow.  Eat soups and other clear broths.  Have a healthy diet.  Rest as needed.  Go back to work when your fever is gone or your doctor says it is okay.  You may need to stay home longer to avoid giving your URI to others.  You can also wear a face mask and wash your hands often to prevent spread of the virus.  Use your inhaler more if you have asthma.  Do not use any tobacco products, including cigarettes, chewing tobacco, or electronic cigarettes. If you need help quitting, ask your doctor. GET HELP IF:  You are getting worse, not better.  Your symptoms are not helped by medicine.  You have  chills.  You are getting more short of breath.  You have brown or red mucus.  You have yellow or brown discharge from your nose.  You have pain in your face, especially when you bend forward.  You have a fever.  You have puffy (swollen) neck glands.  You have pain while swallowing.  You have white areas in the back of your throat. GET HELP RIGHT AWAY IF:   You have very bad or constant:  Headache.  Ear pain.  Pain in your forehead, behind your eyes, and over your cheekbones (sinus pain).  Chest pain.  You have long-lasting (chronic) lung disease and any of the following:  Wheezing.  Long-lasting cough.  Coughing up blood.  A change in your usual mucus.  You have a stiff neck.  You have changes in your:  Vision.  Hearing.  Thinking.  Mood. MAKE SURE YOU:   Understand these instructions.  Will watch your condition.  Will get help right away if you are not doing well or get worse.   This information is not intended to replace advice given to you by your health care provider. Make sure you discuss any questions you have with your health care provider.   Document Released: 12/23/2007 Document Revised: 11/20/2014 Document Reviewed: 10/11/2013 Elsevier Interactive Patient Education [employer].

## 2015-11-30 NOTE — ED Notes (Signed)
Pt states she feels achy, has some nasal congestion. Pt also states she has a sore throat. Denies any fevers/chills.

## 2015-11-30 NOTE — ED Provider Notes (Signed)
CSN: [id number]     Arrival date & time 11/30/15  0144 History  By signing my name below, I, Irene Pap, attest that this documentation has been prepared under the direction and in the presence of Leo Grosser, MD. Electronically Signed: Irene Pap, ED Scribe. 11/30/2015. 2:16 AM.   Chief Complaint  Patient presents with  . Fatigue  . Sore Throat   Patient is a 50 y.o. female presenting with pharyngitis. The history is provided by the patient. No language interpreter was used.  Sore Throat This is a new problem. The current episode started more than 2 days ago. The problem occurs constantly. The problem has not changed since onset.She has tried nothing for the symptoms.  HPI Comments: Dhyana Oesterle is a 50 y.o. Female with a hx of HTN, DM, and anxiety who presents to the Emergency Department complaining of constant sore throat onset a few days ago. Pt reports associated fatigue, nasal congestion and generalized myalgias. Pt did not receive the flu shot this year. She has not tried anything for her symptoms. She denies fever, chills, or cough. Pt is requesting refills on her Metformin, Zoloft and Lisinopril medications. She does not have a PCP in the area.   Past Medical History  Diagnosis Date  . Hypertension   . Diabetes mellitus without complication ([place])   . Anxiety   . GERD (gastroesophageal reflux disease)    Past Surgical History  Procedure Laterality Date  . Cesarean section     History reviewed. No pertinent family history. Social History  Substance Use Topics  . Smoking status: Never Smoker   . Smokeless tobacco: None  . Alcohol Use: No   OB History    No data available     Review of Systems  Constitutional: Positive for fatigue. Negative for fever and chills.  HENT: Positive for congestion and sore throat.   Respiratory: Negative for cough.   Musculoskeletal: Positive for myalgias.  All other systems reviewed and are negative.  Allergies  Aspirin  Home  Medications   Prior to Admission medications   Medication Sig Start Date End Date Taking? Authorizing Provider  acetaminophen (TYLENOL) 500 MG tablet Take 1,000 mg by mouth every 6 (six) hours as needed for headache.    Historical Provider, MD  lisinopril-hydrochlorothiazide (PRINZIDE,ZESTORETIC) 20-25 MG tablet Take 1 tablet by mouth daily. 11/30/15   Leo Grosser, MD  metFORMIN (GLUCOPHAGE) 500 MG tablet Take 2 tablets (1,000 mg total) by mouth 2 (two) times daily with a meal. 11/30/15   Leo Grosser, MD  omeprazole (PRILOSEC) 20 MG capsule Take 1 capsule (20 mg total) by mouth daily. 08/16/15   [name], MD  sertraline (ZOLOFT) 50 MG tablet Take 3 tablets (150 mg total) by mouth daily. 08/16/15   [name], MD   BP 155/86 mmHg  Pulse 92  Temp(Src) 97.7 F (36.5 C) (Oral)  Resp 18  SpO2 97%  LMP 09/23/2015 Physical Exam  Constitutional: She is oriented to person, place, and time. She appears well-developed and well-nourished.  HENT:  Head: Normocephalic and atraumatic.  Mouth/Throat: Oropharynx is clear and moist. No oropharyngeal exudate.  Eyes: EOM are normal. Pupils are equal, round, and reactive to light.  Neck: Normal range of motion. Neck supple.  Cardiovascular: Normal rate, regular rhythm and normal heart sounds.  Exam reveals no gallop and no friction rub.   No murmur heard. Pulmonary/Chest: Effort normal and breath sounds normal. She has no wheezes.  Abdominal: Soft. Bowel sounds are normal.  There is no tenderness.  Musculoskeletal: Normal range of motion.  Neurological: She is alert and oriented to person, place, and time.  Skin: Skin is warm and dry.  Psychiatric: She has a normal mood and affect. Her behavior is normal.  Nursing note and vitals reviewed.   ED Course  Procedures (including critical care time) DIAGNOSTIC STUDIES: Oxygen Saturation is 97% on RA, normal by my interpretation.    COORDINATION OF CARE: 2:06 AM-Discussed treatment  plan which includes conservative care with pt at bedside and pt agreed to plan.    Labs Review Labs Reviewed - No data to display  Imaging Review No results found. I have personally reviewed and evaluated these images and lab results as part of my medical decision-making.   EKG Interpretation None      MDM   Final diagnoses:  Runny nose  Encounter for medication refill    50 year old female presents with mild upper respirations symptoms including a running nose and sore throat. She has no fever, no vital sign abnormalities, she appears to have a very mild upper respiratory infection or potentially allergic rhinitis. She is requesting medication refills after she ran out of the medication provided for her that was prescribed from the emergency department several months ago. I explained that it is inappropriate for the emergency department to continually refill a psychiatric medication such as Zoloft without monitoring and follow-up. I agreed to refill her metformin and blood pressure medications for the next 2 weeks until she is able to reestablish care with a primary care provider who can more safely monitor her for adverse effects of ongoing medication therapies.  I personally performed the services described in this documentation, which was scribed in my presence. The recorded information has been reviewed and is accurate.      Leo Grosser, MD 11/30/15 [phone]

## 2015-12-09 ENCOUNTER — Emergency Department (HOSPITAL_COMMUNITY)
Admission: EM | Admit: 2015-12-09 | Discharge: 2015-12-09 | Discharge status: Against Medical Advice | Payer: Medicaid Other

## 2015-12-09 NOTE — ED Notes (Signed)
Patient left AMA.

## 2016-12-25 ENCOUNTER — Other Ambulatory Visit: Payer: Self-pay | Admitting: Nurse Practitioner

## 2018-07-16 ENCOUNTER — Emergency Department (HOSPITAL_COMMUNITY)
Admission: EM | Admit: 2018-07-16 | Discharge: 2018-07-16 | Disposition: A | Discharge status: Home / Self Care | Payer: Medicaid Other | Attending: Emergency Medicine | Admitting: Emergency Medicine

## 2018-07-16 DIAGNOSIS — Z79899 Other long term (current) drug therapy: Secondary | ICD-10-CM | POA: Diagnosis not present

## 2018-07-16 DIAGNOSIS — R739 Hyperglycemia, unspecified: Secondary | ICD-10-CM

## 2018-07-16 DIAGNOSIS — I10 Essential (primary) hypertension: Secondary | ICD-10-CM | POA: Insufficient documentation

## 2018-07-16 DIAGNOSIS — E1165 Type 2 diabetes mellitus with hyperglycemia: Secondary | ICD-10-CM | POA: Insufficient documentation

## 2018-07-16 LAB — CBC WITH DIFFERENTIAL/PLATELET
ABS IMMATURE GRANULOCYTES: 0.02 10*3/uL (ref 0.00–0.07)
BASOS ABS: 0 10*3/uL (ref 0.0–0.1)
BASOS PCT: 1 %
Eosinophils Absolute: 0.1 10*3/uL (ref 0.0–0.5)
Eosinophils Relative: 2 %
HEMATOCRIT: 33.6 % — AB (ref 36.0–46.0)
Hemoglobin: 11.2 g/dL — ABNORMAL LOW (ref 12.0–15.0)
IMMATURE GRANULOCYTES: 0 %
LYMPHS ABS: 1.4 10*3/uL (ref 0.7–4.0)
Lymphocytes Relative: 23 %
MCH: 27.4 pg (ref 26.0–34.0)
MCHC: 33.3 g/dL (ref 30.0–36.0)
MCV: 82.2 fL (ref 80.0–100.0)
MONOS PCT: 7 %
Monocytes Absolute: 0.4 10*3/uL (ref 0.1–1.0)
NEUTROS ABS: 4.1 10*3/uL (ref 1.7–7.7)
NEUTROS PCT: 67 %
NRBC: 0 % (ref 0.0–0.2)
PLATELETS: 195 10*3/uL (ref 150–400)
RBC: 4.09 MIL/uL (ref 3.87–5.11)
RDW: 12.8 % (ref 11.5–15.5)
WBC: 6 10*3/uL (ref 4.0–10.5)

## 2018-07-16 LAB — I-STAT VENOUS BLOOD GAS, ED
ACID-BASE EXCESS: 1 mmol/L (ref 0.0–2.0)
Bicarbonate: 25.6 mmol/L (ref 20.0–28.0)
O2 SAT: 90 %
TCO2: 27 mmol/L (ref 22–32)
pCO2, Ven: 41.1 mmHg — ABNORMAL LOW (ref 44.0–60.0)
pH, Ven: 7.402 (ref 7.250–7.430)
pO2, Ven: 59 mmHg — ABNORMAL HIGH (ref 32.0–45.0)

## 2018-07-16 LAB — COMPREHENSIVE METABOLIC PANEL
ALBUMIN: 3.8 g/dL (ref 3.5–5.0)
ALT: 17 U/L (ref 0–44)
AST: 21 U/L (ref 15–41)
Alkaline Phosphatase: 60 U/L (ref 38–126)
Anion gap: 11 (ref 5–15)
BUN: 16 mg/dL (ref 6–20)
CHLORIDE: 96 mmol/L — AB (ref 98–111)
CO2: 21 mmol/L — ABNORMAL LOW (ref 22–32)
CREATININE: 1.13 mg/dL — AB (ref 0.44–1.00)
Calcium: 9.2 mg/dL (ref 8.9–10.3)
GFR calc Af Amer: >60 mL/min (ref >60)
GFR, EST NON AFRICAN AMERICAN: 57 mL/min — AB (ref >60)
GLUCOSE: 644 mg/dL — AB (ref 70–99)
POTASSIUM: 4.8 mmol/L (ref 3.5–5.1)
Sodium: 128 mmol/L — ABNORMAL LOW (ref 135–145)
Total Bilirubin: 0.5 mg/dL (ref 0.3–1.2)
Total Protein: 7.6 g/dL (ref 6.5–8.1)

## 2018-07-16 LAB — URINALYSIS, ROUTINE W REFLEX MICROSCOPIC
BACTERIA UA: NONE SEEN
BILIRUBIN URINE: NEGATIVE
Hgb urine dipstick: NEGATIVE
Ketones, ur: NEGATIVE mg/dL
Nitrite: NEGATIVE
PH: 6 (ref 5.0–8.0)
Protein, ur: NEGATIVE mg/dL
SPECIFIC GRAVITY, URINE: 1.023 (ref 1.005–1.030)

## 2018-07-16 LAB — CBG MONITORING, ED
Glucose-Capillary: >600 mg/dL (ref 70–99)
Glucose-Capillary: 277 mg/dL — ABNORMAL HIGH (ref 70–99)

## 2018-07-16 MED ORDER — INSULIN ASPART 100 UNIT/ML ~~LOC~~ SOLN
5.0000 [IU] | Freq: Once | SUBCUTANEOUS | Status: AC
  Administered 2018-07-16: 5 [IU] via INTRAVENOUS

## 2018-07-16 MED ORDER — METFORMIN HCL 500 MG PO TABS
500.0000 mg | ORAL_TABLET | Freq: Once | ORAL | Status: AC
  Administered 2018-07-16: 500 mg via ORAL
  Filled 2018-07-16: qty 1

## 2018-07-16 MED ORDER — SODIUM CHLORIDE 0.9 % IV BOLUS
1000.0000 mL | Freq: Once | INTRAVENOUS | Status: AC
  Administered 2018-07-16: 1000 mL via INTRAVENOUS

## 2018-07-16 MED ORDER — METFORMIN HCL 500 MG PO TABS: 500.0000 mg | ORAL_TABLET | Freq: Two times a day (BID) | ORAL | 0 refills | Status: DC

## 2018-07-16 NOTE — ED Triage Notes (Signed)
Pt [facility] yesterday for routine appointment and had blood drawn. Called this morning and told her Blood sugar was over 600 and sent here for evaluation. Denies n/v but endorse drinkings and peeing a lot. VSS

## 2018-07-16 NOTE — ED Notes (Signed)
Patient verbalizes understanding of discharge instructions. Opportunity for questioning and answers were provided. Armband removed by staff, pt discharged from ED.  

## 2018-07-16 NOTE — ED Notes (Signed)
CBG 277, EDP aware

## 2018-07-16 NOTE — ED Provider Notes (Signed)
Emergency Department Provider Note   I have reviewed the triage vital signs and the nursing notes.   HISTORY  Chief Complaint Hyperglycemia and Abnormal Lab   HPI Michele Shaw is a 52 y.o. female with medical problems documented below that is been out of her metformin for approximately 1 week and has had increased sugar intake secondary to the holidays and presents to the emergency department today secondary to hyperglycemia found on routine labs yesterday at her doctor's office.  Patient states she had increased thirst and polyuria as well mild blurry vision even with her glasses.  States compliance with other medications.  No chest pain, cough, shortness of breath, abdominal pain, back pain, lower extremity swelling or other associated symptoms.  No recent illnesses.  Has never been on insulin. No other associated or modifying symptoms.    Past Medical History:  Diagnosis Date  . Anxiety   . Diabetes mellitus without complication ([place])   . GERD (gastroesophageal reflux disease)   . Hypertension     Patient Active Problem List   Diagnosis Date Noted  . Syncope 08/16/2015    Past Surgical History:  Procedure Laterality Date  . CESAREAN SECTION      Current Outpatient Rx  . Order #: [id number] Class: Historical Med  . Order #: [id number] Class: Print  . Order #: [id number] Class: Print  . Order #: [id number] Class: Print  . Order #: [id number] Class: Print    Allergies Aspirin  No family history on file.  Social History Social History   Tobacco Use  . Smoking status: Never Smoker  Substance Use Topics  . Alcohol use: No  . Drug use: No    Review of Systems  All other systems negative except as documented in the HPI. All pertinent positives and negatives as reviewed in the HPI. ____________________________________________   PHYSICAL EXAM:  VITAL SIGNS: ED Triage Vitals [07/16/18 1045]  Enc Vitals Group     BP 125/85     Pulse Rate 99     Resp 16   Temp 98.1 F (36.7 C)     Temp Source Oral     SpO2 96 %     Weight 180 lb (81.6 kg)     Height 5\' 7"  (1.702 m)    Constitutional: Alert and oriented. Well appearing and in no acute distress. Eyes: Conjunctivae are normal. PERRL. EOMI. Head: Atraumatic. Nose: No congestion/rhinnorhea. Mouth/Throat: Mucous membranes are moist.  Oropharynx non-erythematous. Neck: No stridor.  No meningeal signs.   Cardiovascular: tachycardic rate, regular rhythm. Good peripheral circulation. Grossly normal heart sounds.   Respiratory: Normal respiratory effort.  No retractions. Lungs CTAB. Gastrointestinal: Soft and nontender. No distention.  Musculoskeletal: No lower extremity tenderness nor edema. No gross deformities of extremities. Neurologic:  Normal speech and language. No gross focal neurologic deficits are appreciated.  Skin:  Skin is warm, dry and intact. No rash noted.   ____________________________________________   LABS (all labs ordered are listed, but only abnormal results are displayed)  Labs Reviewed  CBC WITH DIFFERENTIAL/PLATELET - Abnormal; Notable for the following components:      Result Value   Hemoglobin 11.2 (*)    HCT 33.6 (*)    All other components within normal limits  COMPREHENSIVE METABOLIC PANEL - Abnormal; Notable for the following components:   Sodium 128 (*)    Chloride 96 (*)    CO2 21 (*)    Glucose, Bld 644 (*)    Creatinine, Ser 1.13 (*)    GFR calc non  Af Amer 57 (*)    All other components within normal limits  URINALYSIS, ROUTINE W REFLEX MICROSCOPIC - Abnormal; Notable for the following components:   Color, Urine STRAW (*)    Glucose, UA >=500 (*)    Leukocytes, UA SMALL (*)    All other components within normal limits  CBG MONITORING, ED - Abnormal; Notable for the following components:   Glucose-Capillary >600 (*)    All other components within normal limits  I-STAT VENOUS BLOOD GAS, ED - Abnormal; Notable for the following components:    pCO2, Ven 41.1 (*)    pO2, Ven 59.0 (*)    All other components within normal limits  CBG MONITORING, ED - Abnormal; Notable for the following components:   Glucose-Capillary 277 (*)    All other components within normal limits  BLOOD GAS, VENOUS   ____________________________________________  EKG   EKG Interpretation  Date/Time:  Saturday July 16 2018 11:24:19 EST Ventricular Rate:  86 PR Interval:    QRS Duration: 89 QT Interval:  393 QTC Calculation: 471 R Axis:   41 Text Interpretation:  Sinus rhythm Ventricular premature complex Baseline wander in lead(s) V5 V6 No significant change since last tracing Confirmed by Merrily Pew [id number]) on 07/16/2018 12:33:34 PM       ____________________________________________  RADIOLOGY  No results found.  ____________________________________________   PROCEDURES  Procedure(s) performed:   Procedures   ____________________________________________   INITIAL IMPRESSION / ASSESSMENT AND PLAN / ED COURSE  Eval for DKA.  If negative will just allow her blood sugar to improve we will refill her metformin.  No DKA. Blood sugar vastly improved. Will dc to fu w/ pcp for further management. Metformin refilled.      Pertinent labs & imaging results that were available during my care of the patient were reviewed by me and considered in my medical decision making (see chart for details).  ____________________________________________  FINAL CLINICAL IMPRESSION(S) / ED DIAGNOSES  Final diagnoses:  Hyperglycemia     MEDICATIONS GIVEN DURING THIS VISIT:  Medications  sodium chloride 0.9 % bolus 1,000 mL (0 mLs Intravenous Stopped 07/16/18 1238)  metFORMIN (GLUCOPHAGE) tablet 500 mg (500 mg Oral Given 07/16/18 1226)  insulin aspart (novoLOG) injection 5 Units (5 Units Intravenous Given 07/16/18 1252)     NEW OUTPATIENT MEDICATIONS STARTED DURING THIS VISIT:  Discharge Medication List as of 07/16/2018  2:52 PM       Note:  This note was prepared with assistance of Dragon voice recognition software. Occasional wrong-word or sound-a-like substitutions may have occurred due to the inherent limitations of voice recognition software.   Merrily Pew, MD 07/16/18 [phone]

## 2018-09-05 ENCOUNTER — Encounter (HOSPITAL_COMMUNITY): Payer: Self-pay | Admitting: Emergency Medicine

## 2018-09-05 ENCOUNTER — Ambulatory Visit (HOSPITAL_COMMUNITY)
Admission: EM | Admit: 2018-09-05 | Discharge: 2018-09-05 | Disposition: A | Discharge status: Home / Self Care | Payer: Medicaid Other | Attending: Internal Medicine | Admitting: Internal Medicine

## 2018-09-05 ENCOUNTER — Other Ambulatory Visit: Payer: Self-pay

## 2018-09-05 DIAGNOSIS — K0889 Other specified disorders of teeth and supporting structures: Secondary | ICD-10-CM | POA: Diagnosis not present

## 2018-09-05 MED ORDER — PENICILLIN V POTASSIUM 500 MG PO TABS: 500.0000 mg | ORAL_TABLET | Freq: Four times a day (QID) | ORAL | 0 refills | Status: AC

## 2018-09-05 NOTE — Discharge Instructions (Signed)
May continue with prescribed tylenol with codeine for pain. Ice application to jaw.  May start antibiotics.  Please follow up with your dentist for recheck and evaluation.

## 2018-09-05 NOTE — ED Triage Notes (Signed)
Patient had a tooth extracted last Wednesday.  Patient reports having pain and concerned for "dry socket "

## 2018-09-05 NOTE — ED Provider Notes (Signed)
[facility]    CSN: [id number] Arrival date & time: 09/05/18  1808     History   Chief Complaint Chief Complaint  Patient presents with  . Dental Pain    HPI Michele Shaw is a 53 y.o. female.   Michele Shaw presents with complaints of worsening of left lower dental pain. She had a tooth pulled under local anesthesia on 2/12. Pain was moderate days following but has increased. Radiates to left ear. Did have swelling but has improved. No known drainage. No known fevers. Has been taking tylenol with codeine which helps some with pain. She is concerned about a "Dry socket". She has not reached out to her dentist, next appointment in august. States she hasn't been using any straws and has been following her care instructions. Hx of dm, gerd, htn.    ROS per HPI.      Past Medical History:  Diagnosis Date  . Anxiety   . Diabetes mellitus without complication ([place])   . GERD (gastroesophageal reflux disease)   . Hypertension     Patient Active Problem List   Diagnosis Date Noted  . Syncope 08/16/2015    Past Surgical History:  Procedure Laterality Date  . CESAREAN SECTION      OB History   No obstetric history on file.      Home Medications    Prior to Admission medications   Medication Sig Start Date End Date Taking? Authorizing Provider  acetaminophen-codeine (TYLENOL #4) 300-60 MG tablet Take 1 tablet by mouth every 4 (four) hours as needed for pain.   Yes [provider]  lisinopril-hydrochlorothiazide (PRINZIDE,ZESTORETIC) 20-25 MG tablet Take 1 tablet by mouth daily. 11/30/15  Yes Leo Grosser, MD  metFORMIN (GLUCOPHAGE) 500 MG tablet Take 1 tablet (500 mg total) by mouth 2 (two) times daily with a meal. 07/16/18  Yes Mesner, Corene Cornea, MD  omeprazole (PRILOSEC) 20 MG capsule Take 1 capsule (20 mg total) by mouth daily. 08/16/15  Yes Pollina, Gwenyth Allegra, MD  sertraline (ZOLOFT) 50 MG tablet Take 3 tablets (150 mg total) by mouth daily. 08/16/15   Yes Pollina, Gwenyth Allegra, MD  SIMVASTATIN PO Take by mouth.   Yes [provider]  acetaminophen (TYLENOL) 500 MG tablet Take 1,000 mg by mouth every 6 (six) hours as needed for headache.    [provider]  penicillin v potassium (VEETID) 500 MG tablet Take 1 tablet (500 mg total) by mouth 4 (four) times daily for 7 days. 09/05/18 09/12/18  Zigmund Gottron, NP    Family History Family History  Problem Relation Age of Onset  . Hypertension Mother   . Hypertension Father     Social History Social History   Tobacco Use  . Smoking status: Never Smoker  Substance Use Topics  . Alcohol use: No  . Drug use: No     Allergies   Aspirin   Review of Systems Review of Systems   Physical Exam Triage Vital Signs ED Triage Vitals  Enc Vitals Group     BP 09/05/18 1917 (!) 101/54     Pulse Rate 09/05/18 1917 87     Resp --      Temp 09/05/18 1917 (!) 97 F (36.1 C)     Temp Source 09/05/18 1917 Temporal     SpO2 09/05/18 1917 98 %     Weight --      Height --      Head Circumference --      Peak Flow --  Pain Score 09/05/18 1913 8     Pain Loc --      Pain Edu? --      Excl. in [place]? --    No data found.  Updated Vital Signs BP (!) 101/54 (BP Location: Right Arm)   Pulse 87   Temp (!) 97 F (36.1 C) (Temporal)   SpO2 98%   Visual Acuity Right Eye Distance:   Left Eye Distance:   Bilateral Distance:    Right Eye Near:   Left Eye Near:    Bilateral Near:     Physical Exam Constitutional:      General: She is not in acute distress.    Appearance: She is well-developed.  HENT:     Mouth/Throat:     Lips: Pink.     Mouth: Mucous membranes are moist.   Cardiovascular:     Rate and Rhythm: Normal rate and regular rhythm.     Heart sounds: Normal heart sounds.  Pulmonary:     Effort: Pulmonary effort is normal.     Breath sounds: Normal breath sounds.  Skin:    General: Skin is warm and dry.  Neurological:     Mental Status: She is  alert and oriented to person, place, and time.      UC Treatments / Results  Labs (all labs ordered are listed, but only abnormal results are displayed) Labs Reviewed - No data to display  EKG None  Radiology No results found.  Procedures Procedures (including critical care time)  Medications Ordered in UC Medications - No data to display  Initial Impression / Assessment and Plan / UC Course  I have reviewed the triage vital signs and the nursing notes.  Pertinent labs & imaging results that were available during my care of the patient were reviewed by me and considered in my medical decision making (see chart for details).     Post operative pain vs developing complication or infection discussed and considered. Exam reassuring at this time. Antibiotics provided, to continue with pain medication as prescribed, ice packs, and follow up with dentist for further evaluation. Patient verbalized understanding and agreeable to plan.   Final Clinical Impressions(s) / UC Diagnoses   Final diagnoses:  Pain, dental     Discharge Instructions     May continue with prescribed tylenol with codeine for pain. Ice application to jaw.  May start antibiotics.  Please follow up with your dentist for recheck and evaluation.    ED Prescriptions    Medication Sig Dispense Auth. Provider   penicillin v potassium (VEETID) 500 MG tablet Take 1 tablet (500 mg total) by mouth 4 (four) times daily for 7 days. 28 tablet Zigmund Gottron, NP     Controlled Substance Prescriptions [place] Controlled Substance Registry consulted? Not Applicable   Zigmund Gottron, NP 09/06/18 1415

## 2018-12-27 ENCOUNTER — Emergency Department (HOSPITAL_COMMUNITY): Payer: Medicaid Other

## 2018-12-27 ENCOUNTER — Encounter (HOSPITAL_COMMUNITY): Payer: Self-pay | Admitting: Emergency Medicine

## 2018-12-27 ENCOUNTER — Emergency Department (HOSPITAL_COMMUNITY)
Admission: EM | Admit: 2018-12-27 | Discharge: 2018-12-27 | Disposition: A | Discharge status: Home / Self Care | Payer: Medicaid Other | Attending: Emergency Medicine | Admitting: Emergency Medicine

## 2018-12-27 ENCOUNTER — Other Ambulatory Visit: Payer: Self-pay

## 2018-12-27 DIAGNOSIS — E119 Type 2 diabetes mellitus without complications: Secondary | ICD-10-CM | POA: Diagnosis not present

## 2018-12-27 DIAGNOSIS — Z79899 Other long term (current) drug therapy: Secondary | ICD-10-CM | POA: Insufficient documentation

## 2018-12-27 DIAGNOSIS — I10 Essential (primary) hypertension: Secondary | ICD-10-CM | POA: Insufficient documentation

## 2018-12-27 DIAGNOSIS — J0130 Acute sphenoidal sinusitis, unspecified: Secondary | ICD-10-CM | POA: Diagnosis not present

## 2018-12-27 DIAGNOSIS — G4489 Other headache syndrome: Secondary | ICD-10-CM | POA: Diagnosis not present

## 2018-12-27 DIAGNOSIS — R51 Headache: Secondary | ICD-10-CM | POA: Diagnosis present

## 2018-12-27 MED ORDER — AMOXICILLIN 500 MG PO CAPS
500.0000 mg | ORAL_CAPSULE | Freq: Once | ORAL | Status: AC
  Administered 2018-12-27: 500 mg via ORAL
  Filled 2018-12-27: qty 1

## 2018-12-27 MED ORDER — METOCLOPRAMIDE HCL 5 MG/ML IJ SOLN
10.0000 mg | Freq: Once | INTRAMUSCULAR | Status: AC
  Administered 2018-12-27: 10 mg via INTRAVENOUS
  Filled 2018-12-27: qty 2

## 2018-12-27 MED ORDER — AMOXICILLIN 500 MG PO CAPS: 500.0000 mg | ORAL_CAPSULE | Freq: Three times a day (TID) | ORAL | 0 refills | Status: DC

## 2018-12-27 MED ORDER — DIPHENHYDRAMINE HCL 50 MG/ML IJ SOLN
25.0000 mg | Freq: Once | INTRAMUSCULAR | Status: AC
  Administered 2018-12-27: 25 mg via INTRAVENOUS
  Filled 2018-12-27: qty 1

## 2018-12-27 NOTE — Discharge Instructions (Addendum)

## 2018-12-27 NOTE — ED Triage Notes (Addendum)
Pt c/o severe R sided HA radiating to rear of head onset yesterday while packing, pt denies n/v/, denies photophobia, denies phonophobia. Pt states she does have HA but not this location or severity. No neuro deficits at this time.  APAP @ 2100. With no relief.

## 2018-12-27 NOTE — ED Provider Notes (Signed)
[facility] Provider Note   CSN: [id number] Arrival date & time: 12/27/18  0005    History   Chief Complaint Chief Complaint  Patient presents with   Headache    HPI Nataleah Scioneaux is a 53 y.o. female.     The history is provided by the patient.  Headache  Pain location:  R parietal and frontal Quality:  Dull Onset quality:  Gradual Duration:  2 days Timing:  Constant Progression:  Worsening Chronicity:  New Relieved by:  Nothing Worsened by:  Nothing Associated symptoms: sinus pressure   Associated symptoms: no blurred vision, no fever, no focal weakness, no visual change, no vomiting and no weakness    Patient with history of diabetes, anxiety, hypertension presents with headache.  She reports gradual onset of right-sided headache for the past 2 days No fevers or vomiting.  No trauma.  She reports history of headaches, but this feels different.. She does report recent congestion and sinus pressure. No history of stroke, no history of aneurysm No tick bites/rash Past Medical History:  Diagnosis Date   Anxiety    Diabetes mellitus without complication ([place])    GERD (gastroesophageal reflux disease)    Hypertension     Patient Active Problem List   Diagnosis Date Noted   Syncope 08/16/2015    Past Surgical History:  Procedure Laterality Date   CESAREAN SECTION       OB History   No obstetric history on file.      Home Medications    Prior to Admission medications   Medication Sig Start Date End Date Taking? Authorizing Provider  acetaminophen (TYLENOL) 500 MG tablet Take 1,000 mg by mouth every 6 (six) hours as needed for headache.    [provider]  acetaminophen-codeine (TYLENOL #4) 300-60 MG tablet Take 1 tablet by mouth every 4 (four) hours as needed for pain.    [provider]  amoxicillin (AMOXIL) 500 MG capsule Take 1 capsule (500 mg total) by mouth 3 (three) times daily. 12/27/18    Ripley Fraise, MD  lisinopril-hydrochlorothiazide (PRINZIDE,ZESTORETIC) 20-25 MG tablet Take 1 tablet by mouth daily. 11/30/15   Leo Grosser, MD  metFORMIN (GLUCOPHAGE) 500 MG tablet Take 1 tablet (500 mg total) by mouth 2 (two) times daily with a meal. 07/16/18   Mesner, Corene Cornea, MD  omeprazole (PRILOSEC) 20 MG capsule Take 1 capsule (20 mg total) by mouth daily. 08/16/15   [name], MD  sertraline (ZOLOFT) 50 MG tablet Take 3 tablets (150 mg total) by mouth daily. 08/16/15   [name], MD  SIMVASTATIN PO Take by mouth.    [provider]    Family History Family History  Problem Relation Age of Onset   Hypertension Mother    Hypertension Father     Social History Social History   Tobacco Use   Smoking status: Never Smoker   Smokeless tobacco: Never Used  Substance Use Topics   Alcohol use: No   Drug use: No     Allergies   Aspirin   Review of Systems Review of Systems  Constitutional: Negative for fever.  HENT: Positive for sinus pressure.   Eyes: Negative for blurred vision and visual disturbance.  Gastrointestinal: Negative for vomiting.  Neurological: Positive for headaches. Negative for focal weakness and weakness.  All other systems reviewed and are negative.    Physical Exam Updated Vital Signs BP 135/78    Pulse (!) 32    Temp 98.4  F (36.9 C) (Oral)    Resp 18    Ht 1.702 m (5\' 7" )    Wt 81.6 kg    SpO2 96%    BMI 28.19 kg/m   Physical Exam CONSTITUTIONAL: Well developed/well nourished HEAD: Normocephalic/atraumatic EYES: EOMI/PERRL, no nystagmus, no ptosis ENMT: Mucous membranes moist, no facial swelling, mild tenderness to right face/forehead No bruising or crepitus NECK: supple no meningeal signs, no bruits SPINE/BACK:entire spine nontender CV: S1/S2 noted, no murmurs/rubs/gallops noted LUNGS: Lungs are clear to auscultation bilaterally, no apparent distress ABDOMEN: soft, nontender, no rebound or  guarding GU:no cva tenderness NEURO:Awake/alert, face symmetric, no arm or leg drift is noted Equal 5/5 strength with shoulder abduction, elbow flex/extension, wrist flex/extension in upper extremities and equal hand grips bilaterally Equal 5/5 strength with hip flexion,knee flex/extension, foot dorsi/plantar flexion Cranial nerves 3/4/5/6/01/25/09/11/12 tested and intact Gait normal without ataxia No past pointing Sensation to light touch intact in all extremities EXTREMITIES: pulses normal, full ROM SKIN: warm, color normal PSYCH: no abnormalities of mood noted, alert and oriented to situation    ED Treatments / Results  Labs (all labs ordered are listed, but only abnormal results are displayed) Labs Reviewed - No data to display  EKG None  Radiology Ct Head Wo Contrast  Result Date: 12/27/2018 CLINICAL DATA:  53 year old female with right side headache for 1 day. No known injury. EXAM: CT HEAD WITHOUT CONTRAST TECHNIQUE: Contiguous axial images were obtained from the base of the skull through the vertex without intravenous contrast. COMPARISON:  None. FINDINGS: Brain: Normal cerebral volume. No midline shift, ventriculomegaly, mass effect, evidence of mass lesion, intracranial hemorrhage or evidence of cortically based acute infarction. Gray-white matter differentiation is within normal limits throughout the brain. Vascular: No suspicious intracranial vascular hyperdensity. Skull: No acute osseous abnormality identified. Sinuses/Orbits: Fluid level in the right sphenoid sinus with mild bilateral ethmoid mucosal thickening. Previous left sphenoid osteotomy. Left maxillary sinus mucous retention cyst, significance doubtful. Small volume retained secretions in the nasopharynx. Tympanic cavities and mastoids are clear. Other: Visualized orbits and scalp soft tissues are within normal limits. IMPRESSION: 1. Normal noncontrast CT appearance of the brain. 2. Fluid level in the right sphenoid sinus  suggesting acute sinusitis. Mild ethmoid mucosal thickening. Previous postoperative changes to the left sphenoid sinus. Electronically Signed   By: Genevie Ann M.D.   On: 12/27/2018 02:17    Procedures Procedures    Medications Ordered in ED Medications  metoCLOPramide (REGLAN) injection 10 mg (10 mg Intravenous Given 12/27/18 0616)  diphenhydrAMINE (BENADRYL) injection 25 mg (25 mg Intravenous Given 12/27/18 0616)  amoxicillin (AMOXIL) capsule 500 mg (500 mg Oral Given 12/27/18 0617)     Initial Impression / Assessment and Plan / ED Course  I have reviewed the triage vital signs and the nursing notes.  Pertinent labs & imaging results that were available during my care of the patient were reviewed by me and considered in my medical decision making (see chart for details).       5:58 AM Pt with gradual onset of HA over past 2 days No neuro deficits She can ambulate Evidence of sinusitis on CT head Will treat with antibiotics Will treat HA with reglan/benadryl Anticipate d/c home Low suspicion for acute neurologic emergency 6:49 AM Patient feels improved.  No neuro deficits.  Suspicion headache is commendation of sinusitis plus cluster headache.  Low suspicion for acute neurologic emergency.  She feels comfortable for discharge home.  Final Clinical Impressions(s) / ED Diagnoses  Final diagnoses:  Other headache syndrome  Acute non-recurrent sphenoidal sinusitis    ED Discharge Orders         Ordered    amoxicillin (AMOXIL) 500 MG capsule  3 times daily     12/27/18 0539           Ripley Fraise, MD 12/27/18 [id number]

## 2018-12-27 NOTE — ED Notes (Signed)
Patient verbalizes understanding of discharge instructions. Opportunity for questioning and answers were provided. Armband removed by staff, pt discharged from ED ambulatory.   

## 2019-01-11 ENCOUNTER — Other Ambulatory Visit: Payer: Self-pay

## 2019-01-11 ENCOUNTER — Encounter: Payer: Self-pay | Admitting: *Deleted

## 2019-01-11 ENCOUNTER — Emergency Department
Admission: EM | Admit: 2019-01-11 | Discharge: 2019-01-11 | Disposition: A | Discharge status: Home / Self Care | Payer: Medicaid Other | Attending: Emergency Medicine | Admitting: Emergency Medicine

## 2019-01-11 ENCOUNTER — Emergency Department: Payer: Medicaid Other

## 2019-01-11 DIAGNOSIS — R079 Chest pain, unspecified: Secondary | ICD-10-CM

## 2019-01-11 DIAGNOSIS — Z79899 Other long term (current) drug therapy: Secondary | ICD-10-CM | POA: Insufficient documentation

## 2019-01-11 DIAGNOSIS — Z7984 Long term (current) use of oral hypoglycemic drugs: Secondary | ICD-10-CM | POA: Insufficient documentation

## 2019-01-11 DIAGNOSIS — E119 Type 2 diabetes mellitus without complications: Secondary | ICD-10-CM | POA: Insufficient documentation

## 2019-01-11 DIAGNOSIS — I10 Essential (primary) hypertension: Secondary | ICD-10-CM | POA: Diagnosis not present

## 2019-01-11 LAB — TROPONIN I (HIGH SENSITIVITY)
Troponin I (High Sensitivity): 3 ng/L (ref <18)
Troponin I (High Sensitivity): 3 ng/L (ref <18)

## 2019-01-11 LAB — CBC
HCT: 34.2 % — ABNORMAL LOW (ref 36.0–46.0)
Hemoglobin: 11.6 g/dL — ABNORMAL LOW (ref 12.0–15.0)
MCH: 29.7 pg (ref 26.0–34.0)
MCHC: 33.9 g/dL (ref 30.0–36.0)
MCV: 87.7 fL (ref 80.0–100.0)
Platelets: 296 10*3/uL (ref 150–400)
RBC: 3.9 MIL/uL (ref 3.87–5.11)
RDW: 12 % (ref 11.5–15.5)
WBC: 6.2 10*3/uL (ref 4.0–10.5)
nRBC: 0 % (ref 0.0–0.2)

## 2019-01-11 LAB — BASIC METABOLIC PANEL
Anion gap: 11 (ref 5–15)
BUN: 23 mg/dL — ABNORMAL HIGH (ref 6–20)
CO2: 22 mmol/L (ref 22–32)
Calcium: 9 mg/dL (ref 8.9–10.3)
Chloride: 99 mmol/L (ref 98–111)
Creatinine, Ser: 1.15 mg/dL — ABNORMAL HIGH (ref 0.44–1.00)
GFR calc Af Amer: >60 mL/min (ref >60)
GFR calc non Af Amer: 55 mL/min — ABNORMAL LOW (ref >60)
Glucose, Bld: 471 mg/dL — ABNORMAL HIGH (ref 70–99)
Potassium: 4.4 mmol/L (ref 3.5–5.1)
Sodium: 132 mmol/L — ABNORMAL LOW (ref 135–145)

## 2019-01-11 MED ORDER — SODIUM CHLORIDE 0.9% FLUSH: 3.0000 mL | Freq: Once | INTRAVENOUS | Status: DC

## 2019-01-11 MED ORDER — SODIUM CHLORIDE 0.9 % IV BOLUS
1000.0000 mL | Freq: Once | INTRAVENOUS | Status: AC
  Administered 2019-01-11: 1000 mL via INTRAVENOUS

## 2019-01-11 NOTE — ED Provider Notes (Signed)
[facility] Emergency Department Provider Note   ____________________________________________   I have reviewed the triage vital signs and the nursing notes.   HISTORY  Chief Complaint Chest Pain   History limited by: Not Limited   HPI Michele Shaw is a 53 y.o. female who presents to the emergency department today because of concern for an episode of chest tightness. She states she was at work when it started. It felt like a tightness in the middle of her chest. She did have some associated shortness of breath. Denies any radiation of the pain. At the time of my examination she states that the pain has improved. The patient denies any history of cardiac disease. States she has been under a lot of stress recently.   Records reviewed. Per medical record review patient has a history of DM, GERD, HTN.   Past Medical History:  Diagnosis Date  . Anxiety   . Diabetes mellitus without complication ([place])   . GERD (gastroesophageal reflux disease)   . Hypertension     Patient Active Problem List   Diagnosis Date Noted  . Syncope 08/16/2015    Past Surgical History:  Procedure Laterality Date  . CESAREAN SECTION      Prior to Admission medications   Medication Sig Start Date End Date Taking? Authorizing Provider  acetaminophen (TYLENOL) 500 MG tablet Take 1,000 mg by mouth every 6 (six) hours as needed for headache.    [provider]  acetaminophen-codeine (TYLENOL #4) 300-60 MG tablet Take 1 tablet by mouth every 4 (four) hours as needed for pain.    [provider]  amoxicillin (AMOXIL) 500 MG capsule Take 1 capsule (500 mg total) by mouth 3 (three) times daily. 12/27/18   Ripley Fraise, MD  lisinopril-hydrochlorothiazide (PRINZIDE,ZESTORETIC) 20-25 MG tablet Take 1 tablet by mouth daily. 11/30/15   Leo Grosser, MD  metFORMIN (GLUCOPHAGE) 500 MG tablet Take 1 tablet (500 mg total) by mouth 2 (two) times daily with a meal. 07/16/18    Mesner, Corene Cornea, MD  omeprazole (PRILOSEC) 20 MG capsule Take 1 capsule (20 mg total) by mouth daily. 08/16/15   [name], MD  sertraline (ZOLOFT) 50 MG tablet Take 3 tablets (150 mg total) by mouth daily. 08/16/15   [name], MD  SIMVASTATIN PO Take by mouth.    [provider]    Allergies Aspirin  Family History  Problem Relation Age of Onset  . Hypertension Mother   . Hypertension Father     Social History Social History   Tobacco Use  . Smoking status: Never Smoker  . Smokeless tobacco: Never Used  Substance Use Topics  . Alcohol use: No  . Drug use: No    Review of Systems Constitutional: No fever/chills Eyes: No visual changes. ENT: No sore throat. Cardiovascular: Positive for chest tightness.  Respiratory: Positive for shortness of breath. Gastrointestinal: No abdominal pain.  No nausea, no vomiting.  No diarrhea.   Genitourinary: Negative for dysuria. Musculoskeletal: Negative for back pain. Skin: Negative for rash. Neurological: Negative for headaches, focal weakness or numbness.  ____________________________________________   PHYSICAL EXAM:  VITAL SIGNS: ED Triage Vitals  Enc Vitals Group     BP 01/11/19 1502 119/67     Pulse Rate 01/11/19 1502 85     Resp 01/11/19 1502 20     Temp 01/11/19 1502 98.7 F (37.1 C)     Temp Source 01/11/19 1502 Oral     SpO2 01/11/19 1502 97 %  Weight --      Height --      Head Circumference --      Peak Flow --      Pain Score 01/11/19 1509 0   Constitutional: Alert and oriented.  Eyes: Conjunctivae are normal.  ENT      Head: Normocephalic and atraumatic.      Nose: No congestion/rhinnorhea.      Mouth/Throat: Mucous membranes are moist.      Neck: No stridor. Hematological/Lymphatic/Immunilogical: No cervical lymphadenopathy. Cardiovascular: Normal rate, regular rhythm.  No murmurs, rubs, or gallops.  Respiratory: Normal respiratory effort without tachypnea nor  retractions. Breath sounds are clear and equal bilaterally. No wheezes/rales/rhonchi. Gastrointestinal: Soft and non tender. No rebound. No guarding.  Genitourinary: Deferred Musculoskeletal: Normal range of motion in all extremities. No lower extremity edema. Neurologic:  Normal speech and language. No gross focal neurologic deficits are appreciated.  Skin:  Skin is warm, dry and intact. No rash noted. Psychiatric: Mood and affect are normal. Speech and behavior are normal. Patient exhibits appropriate insight and judgment.  ____________________________________________    LABS (pertinent positives/negatives)  High sensitivity troponin 3 x 2 BMP na 132, glu 471 CBC wbc 6.2, hgb 11.6, plt 296  ____________________________________________   EKG  I, Nance Pear, attending physician, personally viewed and interpreted this EKG  EKG Time: 1505 Rate: 87 Rhythm: sinus rhythm with frequent PVC Axis: left axis deviation Intervals: qtc 466 QRS: narrow, LVH ST changes: no st elevation Impression: abnormal ekg   ____________________________________________    RADIOLOGY  CXR No acute abnormality  ____________________________________________   PROCEDURES  Procedures  ____________________________________________   INITIAL IMPRESSION / ASSESSMENT AND PLAN / ED COURSE  Pertinent labs & imaging results that were available during my care of the patient were reviewed by me and considered in my medical decision making (see chart for details).   Patient presented to the emergency department today because of concerns for chest pain.  Differential would be broad including ACS, PE, pneumothorax, pneumonia, anxiety, esophagitis amongst other etiologies.  Patient troponin negative x2.  Chest x-ray without concerning findings.  Patient's pain did resolve while here in the emergency department.  Patient does states she is under a lot of stress I do wonder if this could be contributing  to the patient's pain.  Will plan on discharge.  Discussed with patient portance of follow-up.  ____________________________________________   FINAL CLINICAL IMPRESSION(S) / ED DIAGNOSES  Final diagnoses:  Nonspecific chest pain     Note: This dictation was prepared with Dragon dictation. Any transcriptional errors that result from this process are unintentional     Nance Pear, MD 01/11/19 2130

## 2019-01-11 NOTE — ED Notes (Signed)
EDP in with patient 

## 2019-01-11 NOTE — ED Notes (Signed)
Pt started having chest pain at work- stated EKG was good per EMS- EMS suggested she come be seen

## 2019-01-11 NOTE — ED Notes (Signed)
Patient transported to X-ray 

## 2019-01-11 NOTE — ED Triage Notes (Signed)
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 Pt states CP was poking and sharp then faded to aching. Pt states CP radiated to her back. Pt states reproducible CP w/ inspiration. Pt is presently w/o CP. Pt is in no observable distress at this time.

## 2019-01-11 NOTE — Discharge Instructions (Addendum)
Please seek medical attention for any high fevers, chest pain, shortness of breath, change in behavior, persistent vomiting, bloody stool or any other new or concerning symptoms.  

## 2019-01-26 ENCOUNTER — Emergency Department (HOSPITAL_BASED_OUTPATIENT_CLINIC_OR_DEPARTMENT_OTHER)
Admission: EM | Admit: 2019-01-26 | Discharge: 2019-01-26 | Disposition: A | Discharge status: Home / Self Care | Payer: Medicaid Other | Attending: Emergency Medicine | Admitting: Emergency Medicine

## 2019-01-26 ENCOUNTER — Other Ambulatory Visit: Payer: Self-pay

## 2019-01-26 ENCOUNTER — Encounter (HOSPITAL_BASED_OUTPATIENT_CLINIC_OR_DEPARTMENT_OTHER): Payer: Self-pay | Admitting: Emergency Medicine

## 2019-01-26 DIAGNOSIS — E119 Type 2 diabetes mellitus without complications: Secondary | ICD-10-CM | POA: Diagnosis not present

## 2019-01-26 DIAGNOSIS — Z79899 Other long term (current) drug therapy: Secondary | ICD-10-CM | POA: Diagnosis not present

## 2019-01-26 DIAGNOSIS — L02415 Cutaneous abscess of right lower limb: Secondary | ICD-10-CM | POA: Diagnosis present

## 2019-01-26 DIAGNOSIS — Z794 Long term (current) use of insulin: Secondary | ICD-10-CM | POA: Diagnosis not present

## 2019-01-26 DIAGNOSIS — I10 Essential (primary) hypertension: Secondary | ICD-10-CM | POA: Insufficient documentation

## 2019-01-26 DIAGNOSIS — L0291 Cutaneous abscess, unspecified: Secondary | ICD-10-CM

## 2019-01-26 MED ORDER — SULFAMETHOXAZOLE-TRIMETHOPRIM 800-160 MG PO TABS
1.0000 | ORAL_TABLET | Freq: Once | ORAL | Status: AC
  Administered 2019-01-26: 1 via ORAL
  Filled 2019-01-26: qty 1

## 2019-01-26 MED ORDER — LIDOCAINE-EPINEPHRINE (PF) 2 %-1:200000 IJ SOLN
10.0000 mL | Freq: Once | INTRAMUSCULAR | Status: AC
  Administered 2019-01-26: 10 mL
  Filled 2019-01-26 (×2): qty 10

## 2019-01-26 MED ORDER — SULFAMETHOXAZOLE-TRIMETHOPRIM 800-160 MG PO TABS: 1.0000 | ORAL_TABLET | Freq: Two times a day (BID) | ORAL | 0 refills | Status: AC

## 2019-01-26 MED FILL — SULFAMETHOXAZOLE-TMP DS TAB: 800-160 | 7 days supply | Qty: 14 | Fill #0

## 2019-01-26 NOTE — ED Notes (Signed)
Pt verbalized understanding of dc instructions.

## 2019-01-26 NOTE — ED Triage Notes (Signed)
"  Bump" in right groin area for a few days.  Tender to touch.  No drainage.

## 2019-01-26 NOTE — ED Notes (Signed)
ED Provider at bedside for I&D.   

## 2019-01-26 NOTE — ED Provider Notes (Signed)
[facility] EMERGENCY DEPARTMENT Provider Note   CSN: [id number] Arrival date & time: 01/26/19  0700    History   Chief Complaint Chief Complaint  Patient presents with  . Abscess    HPI Michele Shaw is a 53 y.o. female.     Pt presents to the ED today with a possible boil on her right inner thigh.  Pt noticed it a few days ago and it's gotten bigger and more painful.  She's never had one in the past.  No f/c.  No sob or cough.     Past Medical History:  Diagnosis Date  . Anxiety   . Diabetes mellitus without complication ([place])   . GERD (gastroesophageal reflux disease)   . Hypertension     Patient Active Problem List   Diagnosis Date Noted  . Syncope 08/16/2015    Past Surgical History:  Procedure Laterality Date  . CESAREAN SECTION       OB History   No obstetric history on file.      Home Medications    Prior to Admission medications   Medication Sig Start Date End Date Taking? Authorizing Provider  acyclovir (ZOVIRAX) 400 MG tablet Take 400 mg by mouth 2 (two) times daily. 12/25/18   [provider]  LEVEMIR FLEXTOUCH 100 UNIT/ML Pen INJECT 15 UNITS SUBCUTANEOUSLY ONCE DAILY AT BEDTIME 01/23/19   [provider]  lisinopril-hydrochlorothiazide (PRINZIDE,ZESTORETIC) 20-25 MG tablet Take 1 tablet by mouth daily. 11/30/15   Leo Grosser, MD  omeprazole (PRILOSEC) 20 MG capsule Take 1 capsule (20 mg total) by mouth daily. 08/16/15   [name], MD  PAZEO 0.7 % SOLN INSTILL 1 DROP INTO EACH EYE IN THE MORNING 01/17/19   [provider]  sertraline (ZOLOFT) 50 MG tablet Take 3 tablets (150 mg total) by mouth daily. 08/16/15   [name], MD  simvastatin (ZOCOR) 20 MG tablet TAKE 1 TABLET BY MOUTH ONCE DAILY IN THE EVENING FOR 90 DAYS 01/14/19   [provider]  sulfamethoxazole-trimethoprim (BACTRIM DS) 800-160 MG tablet Take 1 tablet by mouth 2 (two) times daily for 7 days. 01/26/19 02/02/19   Isla Pence, MD  TRADJENTA 5 MG TABS tablet Take 5 mg by mouth daily. 01/23/19   [provider]    Family History Family History  Problem Relation Age of Onset  . Hypertension Mother   . Hypertension Father     Social History Social History   Tobacco Use  . Smoking status: Never Smoker  . Smokeless tobacco: Never Used  Substance Use Topics  . Alcohol use: No  . Drug use: No     Allergies   Aspirin   Review of Systems Review of Systems  Skin: Positive for wound.  All other systems reviewed and are negative.    Physical Exam Updated Vital Signs BP 135/72 (BP Location: Right Arm)   Pulse 87   Temp 98.4 F (36.9 C) (Oral)   Resp 16   Ht 5\' 7"  (1.702 m)   Wt 75.3 kg   SpO2 99%   BMI 26.00 kg/m   Physical Exam Vitals signs and nursing note reviewed.  Constitutional:      Appearance: Normal appearance.  HENT:     Head: Normocephalic and atraumatic.     Right Ear: External ear normal.     Left Ear: External ear normal.     Nose: Nose normal.     Mouth/Throat:     Mouth: Mucous membranes are moist.  Pharynx: Oropharynx is clear.  Eyes:     Extraocular Movements: Extraocular movements intact.     Conjunctiva/sclera: Conjunctivae normal.     Pupils: Pupils are equal, round, and reactive to light.  Neck:     Musculoskeletal: Normal range of motion and neck supple.  Cardiovascular:     Rate and Rhythm: Normal rate and regular rhythm.     Pulses: Normal pulses.     Heart sounds: Normal heart sounds.  Pulmonary:     Effort: Pulmonary effort is normal.     Breath sounds: Normal breath sounds.  Abdominal:     General: Abdomen is flat. Bowel sounds are normal.  Musculoskeletal: Normal range of motion.  Skin:    General: Skin is warm.     Capillary Refill: Capillary refill takes less than 2 seconds.  Neurological:     General: No focal deficit present.     Mental Status: She is alert and oriented to person, place, and time.  Psychiatric:         Mood and Affect: Mood normal.        Behavior: Behavior normal.      ED Treatments / Results  Labs (all labs ordered are listed, but only abnormal results are displayed) Labs Reviewed - No data to display  EKG None  Radiology No results found.  Procedures .Marland KitchenIncision and Drainage  Date/Time: 01/26/2019 7:47 AM Performed by: Isla Pence, MD Authorized by: Isla Pence, MD   Consent:    Consent obtained:  Verbal   Consent given by:  Patient   Risks discussed:  Bleeding   Alternatives discussed:  No treatment Location:    Type:  Abscess   Size:  3   Location:  Anogenital   Anogenital location:  [place] Pre-procedure details:    Skin preparation:  Betadine Anesthesia (see MAR for exact dosages):    Anesthesia method:  Local infiltration   Local anesthetic:  Lidocaine 2% WITH epi Procedure type:    Complexity:  Simple Procedure details:    Incision types:  Cruciate   Scalpel blade:  11   Wound management:  Probed and deloculated and irrigated with saline   Drainage:  Purulent   Drainage amount:  Moderate   Wound treatment:  Wound left open   Packing materials:  None Post-procedure details:    Patient tolerance of procedure:  Tolerated well, no immediate complications   (including critical care time)  Medications Ordered in ED Medications  sulfamethoxazole-trimethoprim (BACTRIM DS) 800-160 MG per tablet 1 tablet (has no administration in time range)  lidocaine-EPINEPHrine (XYLOCAINE W/EPI) 2 %-1:200000 (PF) injection 10 mL (10 mLs Infiltration Given 01/26/19 0723)     Initial Impression / Assessment and Plan / ED Course  I have reviewed the triage vital signs and the nursing notes.  Pertinent labs & imaging results that were available during my care of the patient were reviewed by me and considered in my medical decision making (see chart for details).       Pt encouraged to return if sx worsen.  F/u with pcp.  Final Clinical Impressions(s) / ED  Diagnoses   Final diagnoses:  Abscess    ED Discharge Orders         Ordered    sulfamethoxazole-trimethoprim (BACTRIM DS) 800-160 MG tablet  2 times daily     01/26/19 0746           Isla Pence, MD 01/26/19 [phone]

## 2019-05-02 ENCOUNTER — Other Ambulatory Visit: Payer: Self-pay | Admitting: Endocrinology

## 2019-05-02 DIAGNOSIS — Z1231 Encounter for screening mammogram for malignant neoplasm of breast: Secondary | ICD-10-CM

## 2019-09-14 ENCOUNTER — Encounter (HOSPITAL_COMMUNITY): Payer: Self-pay

## 2019-09-14 ENCOUNTER — Emergency Department (HOSPITAL_COMMUNITY)
Admission: EM | Admit: 2019-09-14 | Discharge: 2019-09-14 | Disposition: A | Discharge status: Home / Self Care | Payer: Medicaid Other | Attending: Emergency Medicine | Admitting: Emergency Medicine

## 2019-09-14 ENCOUNTER — Other Ambulatory Visit: Payer: Self-pay

## 2019-09-14 ENCOUNTER — Emergency Department (HOSPITAL_COMMUNITY): Payer: Medicaid Other

## 2019-09-14 DIAGNOSIS — R0789 Other chest pain: Secondary | ICD-10-CM | POA: Diagnosis present

## 2019-09-14 DIAGNOSIS — E119 Type 2 diabetes mellitus without complications: Secondary | ICD-10-CM | POA: Diagnosis not present

## 2019-09-14 DIAGNOSIS — Z794 Long term (current) use of insulin: Secondary | ICD-10-CM | POA: Insufficient documentation

## 2019-09-14 DIAGNOSIS — Z79899 Other long term (current) drug therapy: Secondary | ICD-10-CM | POA: Insufficient documentation

## 2019-09-14 DIAGNOSIS — I10 Essential (primary) hypertension: Secondary | ICD-10-CM | POA: Insufficient documentation

## 2019-09-14 MED ORDER — OXYCODONE-ACETAMINOPHEN 5-325 MG PO TABS
1.0000 | ORAL_TABLET | Freq: Once | ORAL | Status: AC
  Administered 2019-09-14: 1 via ORAL
  Filled 2019-09-14: qty 1

## 2019-09-14 NOTE — ED Triage Notes (Signed)
Pt reports L sided rib pain that worsens with movement or deep breathing. She also reports cough and wheezing that started about a week ago. A&Ox4. Ambulatory.

## 2019-09-14 NOTE — ED Provider Notes (Addendum)
[facility] DEPT Provider Note   CSN: [id number] Arrival date & time: 09/14/19  [id number]     History Chief Complaint  Patient presents with  . Rib Pain    Michele Shaw is a 54 y.o. female.  HPI    54 year old female presents today complaining of left sided anterolateral sharp chest pain that began this morning.  She describes it as sharp and 8-9 out of 10.  She has not taken anything for this.  It is worse with deep breathing and movement.  She has had no definite trauma but has had a cough for the past week.  She feels that she coughs more in her sleep.  She has had a recent surgery on her right hand but has not been immobilized-she reports that this is healing well.  She denies any history of DVT, PE, or leg swelling.  She denies exposure to COVID-19, but has had cough for the past week.  She denies smoking, fever, chills, nasal congestion, loss of smell.  She denies any history of spontaneous pneumothorax or lung disease.  She is not a smoker.  She is also complaining of some left sided neck pain that began yesterday.  Denies numbness, tingling, or weakness.  She has been taking over-the-counter cough and cold medication for the cough.  She uses Tylenol at home for pain but has not used any recently. Past Medical History:  Diagnosis Date  . Anxiety   . Diabetes mellitus without complication ([place])   . GERD (gastroesophageal reflux disease)   . Hypertension     Patient Active Problem List   Diagnosis Date Noted  . Syncope 08/16/2015    Past Surgical History:  Procedure Laterality Date  . CESAREAN SECTION       OB History   No obstetric history on file.     Family History  Problem Relation Age of Onset  . Hypertension Mother   . Hypertension Father     Social History   Tobacco Use  . Smoking status: Never Smoker  . Smokeless tobacco: Never Used  Substance Use Topics  . Alcohol use: No  . Drug use: No    Home Medications Prior to  Admission medications   Medication Sig Start Date End Date Taking? Authorizing Provider  acyclovir (ZOVIRAX) 400 MG tablet Take 400 mg by mouth 2 (two) times daily. 12/25/18   [provider]  LEVEMIR FLEXTOUCH 100 UNIT/ML Pen INJECT 15 UNITS SUBCUTANEOUSLY ONCE DAILY AT BEDTIME 01/23/19   [provider]  lisinopril-hydrochlorothiazide (PRINZIDE,ZESTORETIC) 20-25 MG tablet Take 1 tablet by mouth daily. 11/30/15   Leo Grosser, MD  omeprazole (PRILOSEC) 20 MG capsule Take 1 capsule (20 mg total) by mouth daily. 08/16/15   [name], MD  PAZEO 0.7 % SOLN INSTILL 1 DROP INTO EACH EYE IN THE MORNING 01/17/19   [provider]  sertraline (ZOLOFT) 50 MG tablet Take 3 tablets (150 mg total) by mouth daily. 08/16/15   [name], MD  simvastatin (ZOCOR) 20 MG tablet TAKE 1 TABLET BY MOUTH ONCE DAILY IN THE EVENING FOR 90 DAYS 01/14/19   [provider]  TRADJENTA 5 MG TABS tablet Take 5 mg by mouth daily. 01/23/19   [provider]    Allergies    Aspirin  Review of Systems   Review of Systems  All other systems reviewed and are negative.   Physical Exam Updated Vital Signs BP (!) 140/97 (BP Location: Right Arm)   Pulse Marland Kitchen)  59   Temp 97.9 F (36.6 C) (Oral)   Resp 16   SpO2 97%   Physical Exam Vitals and nursing note reviewed. Chaperone present: Patient is on monitor with blood pressure slightly elevated 140/97.  Heart rate is 60, respiratory rate, 16, blood pressure heart rate 90s.  heart rate regular.  Constitutional:      General: She is not in acute distress.    Appearance: Normal appearance. She is not ill-appearing.  HENT:     Head: Normocephalic and atraumatic.     Right Ear: External ear normal.     Left Ear: External ear normal.  Eyes:     Extraocular Movements: Extraocular movements intact.     Pupils: Pupils are equal, round, and reactive to light.  Neck:     Vascular: No carotid bruit.  Cardiovascular:      Rate and Rhythm: Normal rate and regular rhythm.  Pulmonary:     Effort: Pulmonary effort is normal.     Breath sounds: Normal breath sounds.     Comments: ttp inferior to the lateral aspect of left breast at about 5 0clock- reproducible  Abdominal:     General: Abdomen is flat.     Palpations: Abdomen is soft.  Musculoskeletal:        General: No swelling, tenderness, deformity or signs of injury. Normal range of motion.     Cervical back: Normal range of motion and neck supple. No rigidity or tenderness.     Right lower leg: No edema.     Left lower leg: No edema.  Skin:    General: Skin is warm and dry.     Capillary Refill: Capillary refill takes less than 2 seconds.  Neurological:     General: No focal deficit present.     Mental Status: She is alert and oriented to person, place, and time.     Cranial Nerves: No cranial nerve deficit.     Motor: No weakness.  Psychiatric:        Mood and Affect: Mood normal.        Behavior: Behavior normal.     ED Results / Procedures / Treatments   Labs (all labs ordered are listed, but only abnormal results are displayed) Labs Reviewed - No data to display  EKG None  Radiology DG Chest [facility] 1 View  Result Date: 09/14/2019 CLINICAL DATA:  Left chest pain with cough and wheezing EXAM: PORTABLE CHEST 1 VIEW COMPARISON:  01/11/2019 FINDINGS: Low volume chest which accentuates heart size. Normal mediastinal contours. There is no edema, consolidation, effusion, or pneumothorax. Artifact from EKG leads IMPRESSION: Negative low volume chest. Electronically Signed   By: Monte Fantasia M.D.   On: 09/14/2019 07:28    Procedures Procedures (including critical care time)  Medications Ordered in ED Medications - No data to display  ED Course  I have reviewed the triage vital signs and the nursing notes.  Pertinent labs & imaging results that were available during my care of the patient were reviewed by me and considered in my medical  decision making (see chart for details).    MDM Rules/Calculators/A&P                      Patient presents today with left sided chest pain which appears well localized and reproducible.  Patient unable to take nsaids per her report.  She reports having smr at home which she has not used.  Plan lidocaine patch, hot/cold therapy.  Patient does not appear to have any acute underlying etiology- no evidence of spontaneous pneumo, infiltrate, fracture.  Patient with some cough recently- given covid pandemic cannot rule out, but does not appear to be cause of patients presentation. Hypertension here with known htn- patient advised re need for recheck, f/u, and return precautions.   Final Clinical Impression(s) / ED Diagnoses Final diagnoses:  Chest wall pain    Rx / DC Orders ED Discharge Orders    None       Pattricia Boss, MD 09/14/19 [id number]    Pattricia Boss, MD 09/15/19 [phone]

## 2019-09-14 NOTE — Discharge Instructions (Addendum)
Your pain appears to be in the chest wall. However, should pain worsen, shortness of breath occur, or fever or other worsening symptoms, you should follow up or return to ED. Recheck of BP with your doctor.

## 2020-02-16 ENCOUNTER — Encounter (INDEPENDENT_AMBULATORY_CARE_PROVIDER_SITE_OTHER): Payer: Medicaid Other | Admitting: Ophthalmology

## 2020-02-16 ENCOUNTER — Encounter (INDEPENDENT_AMBULATORY_CARE_PROVIDER_SITE_OTHER): Payer: Self-pay

## 2020-02-21 NOTE — Progress Notes (Signed)
[facility] Note  02/26/2020     CHIEF COMPLAINT Patient presents for Retina Evaluation   HISTORY OF PRESENT ILLNESS: Michele Shaw is a 54 y.o. female who presents to the clinic today for:   HPI    Retina Evaluation    In both eyes.  Duration of 1 month.  Treatments tried include no treatments.  I, the attending physician,  performed the HPI with the patient and updated documentation appropriately.          Comments    Retina Eval per Dr. Shirley Muscat- suspicious nevus OS, NPDR OU. Pt states she was told years ago she had a "freckle" in her OS.  Using ATs, Restasis,  and OTC allergy drops OU.  She wears glasses only at night when driving. BS: 260 last night, normally ranges around 300 A1C:        Last edited by Bernarda Caffey, MD on 02/26/2020  1:49 PM. (History)    pt is here on the referral of Dr. Shirley Muscat for concern of choroidal nevus OS, pt endorses being diabetic and hypertensive, but denies any hx of cancer, pt states years ago her eye dr in [place] told her about the nevus, but said it was nothing to worry about  Referring physician: Thurston Hole, OD [address] [place],  [place] [address]   HISTORICAL INFORMATION:   Selected notes from the MEDICAL RECORD NUMBER Referred by Dr. Shirley Muscat LEE: 06.29.21 BCVA: OD 20/60-1, OS 20/100+1 Ocular Hx- Choroidal Nevus OS, NPDR OU, cataracts OU, gl suspect OU.      CURRENT MEDICATIONS: Current Outpatient Medications (Ophthalmic Drugs)  Medication Sig  . PAZEO 0.7 % SOLN INSTILL 1 DROP INTO EACH EYE IN THE MORNING  . azelastine (OPTIVAR) 0.05 % ophthalmic solution   . RESTASIS 0.05 % ophthalmic emulsion 1 drop 2 (two) times daily.   No current facility-administered medications for this visit. (Ophthalmic Drugs)   Current Outpatient Medications (Other)  Medication Sig  . acyclovir (ZOVIRAX) 400 MG tablet Take 400 mg by mouth 2 (two) times daily.  Marland Kitchen LEVEMIR FLEXTOUCH 100 UNIT/ML Pen INJECT  15 UNITS SUBCUTANEOUSLY ONCE DAILY AT BEDTIME  . lisinopril (ZESTRIL) 10 MG tablet Take 10 mg by mouth daily.  . metFORMIN (GLUCOPHAGE) 500 MG tablet Take by mouth.  Marland Kitchen omeprazole (PRILOSEC) 20 MG capsule Take 1 capsule (20 mg total) by mouth daily.  . sertraline (ZOLOFT) 50 MG tablet Take 3 tablets (150 mg total) by mouth daily.  . simvastatin (ZOCOR) 20 MG tablet TAKE 1 TABLET BY MOUTH ONCE DAILY IN THE EVENING FOR 90 DAYS  . TRADJENTA 5 MG TABS tablet Take 5 mg by mouth daily.  Marland Kitchen albuterol (VENTOLIN HFA) 108 (90 Base) MCG/ACT inhaler SMARTSIG:2-4 Puff(s) By Mouth Every 4 Hours PRN  . lisinopril-hydrochlorothiazide (PRINZIDE,ZESTORETIC) 20-25 MG tablet Take 1 tablet by mouth daily. (Patient not taking: Reported on 02/26/2020)  . RELION PEN NEEDLES 31G X 6 MM MISC USE 1 TWICE DAILY  . sertraline (ZOLOFT) 100 MG tablet Take 150 mg by mouth daily.   No current facility-administered medications for this visit. (Other)      REVIEW OF SYSTEMS: ROS    Positive for: Gastrointestinal, Endocrine, Eyes, Psychiatric   Negative for: Constitutional, Neurological, Skin, Genitourinary, Musculoskeletal, HENT, Cardiovascular, Respiratory, Allergic/Imm, Heme/Lymph   Last edited by Leonie Douglas, COA on 02/26/2020  1:12 PM. (History)       ALLERGIES Allergies  Allergen Reactions  . Aspirin Anaphylaxis  PAST MEDICAL HISTORY Past Medical History:  Diagnosis Date  . Anxiety   . Diabetes mellitus without complication ([place])   . GERD (gastroesophageal reflux disease)   . Hypertension    Past Surgical History:  Procedure Laterality Date  . CESAREAN SECTION      FAMILY HISTORY Family History  Problem Relation Age of Onset  . Hypertension Mother   . Hypertension Father     SOCIAL HISTORY Social History   Tobacco Use  . Smoking status: Never Smoker  . Smokeless tobacco: Never Used  Vaping Use  . Vaping Use: Never used  Substance Use Topics  . Alcohol use: No  . Drug use: No          OPHTHALMIC EXAM:  Base Eye Exam    Visual Acuity (Snellen - Linear)      Right Left   Dist [place] 20/80 20/60   Dist ph [place] 20/25 -1 20/30 +2       Tonometry (Tonopen, 1:30 PM)      Right Left   Pressure 20 21       Pupils      Dark Light Shape React APD   Right 4 3 Round Brisk None   Left 4 3.5 Round Brisk None       Visual Fields (Counting fingers)      Left Right    Full Full       Extraocular Movement      Right Left    Full Full       Neuro/Psych    Oriented x3: Yes   Mood/Affect: Normal       Dilation    Both eyes: 1.0% Mydriacyl, 2.5% Phenylephrine @ 1:30 PM        Slit Lamp and Fundus Exam    Slit Lamp Exam      Right Left   Lids/Lashes Dermatochalasis - upper lid Dermatochalasis - upper lid   Conjunctiva/Sclera nasal pinguecula nasal pinguecula   Cornea Punctate epithelial erosions Punctate epithelial erosions   Anterior Chamber Deep and quiet Deep and quiet   Iris Round and dilated Round and dilated   Lens 2+ Nuclear sclerosis, 2+ Cortical cataract 2+ Nuclear sclerosis, 2+ Cortical cataract   Vitreous Vitreous syneresis Vitreous syneresis       Fundus Exam      Right Left   Disc Pink and Sharp, temporal PPA Pink and Sharp, temporal PPA   C/D Ratio 0.4 0.4   Macula Flat, Good foveal reflex, mild RPE mottling and clumping, +Microaneurysms, no edema Flat, Good foveal reflex, RPE mottling and clumping, scattered Microaneurysms, no edema   Vessels Vascular attenuation, mild tortuousity Vascular attenuation, mild tortuousity   Periphery Attached, scattered MA Attached, irregular, flat pigmented CR lesion (1Vx1.5H DD) temporal periphery at 0300, just outside temporal macula, no SRF or orange pigment, scattered MA        Refraction    Manifest Refraction      Sphere Cylinder Axis Dist VA   Right -1.75 +1.00 175 20/20   Left -1.00 +1.25 010 20/20-2          IMAGING AND PROCEDURES  Imaging and Procedures for 02/26/2020  OCT, Retina - OU - Both  Eyes       Right Eye Quality was good. Central Foveal Thickness: 257. Progression has no prior data. Findings include normal foveal contour, no IRF, no SRF.   Left Eye Quality was good. Central Foveal Thickness: 261. Progression has no prior data. Findings include normal  foveal contour, no IRF, no SRF, vitreomacular adhesion  (Flat, hyper reflective CR lesion temporal periphery caught on widefield).   Notes *Images captured and stored on drive  Diagnosis / Impression:  NFP, no IRF/SRF OU OS: Flat, hyper reflective choroidal lesion temporal periphery caught on widefield -- choroidal nevus  Clinical management:  See below  Abbreviations: NFP - Normal foveal profile. CME - cystoid macular edema. PED - pigment epithelial detachment. IRF - intraretinal fluid. SRF - subretinal fluid. EZ - ellipsoid zone. ERM - epiretinal membrane. ORA - outer retinal atrophy. ORT - outer retinal tubulation. SRHM - subretinal hyper-reflective material. IRHM - intraretinal hyper-reflective material        Color Fundus Photography Optos - OU - Both Eyes       Right Eye Progression has no prior data. Disc findings include normal observations. Macula : flat, microaneurysms. Vessels : attenuated. Periphery : hemorrhage (MA's).   Left Eye Progression has no prior data. Disc findings include normal observations. Macula : flat, retinal pigment epithelium abnormalities, microaneurysms, hemorrhage. Vessels : attenuated. Periphery : hemorrhage, RPE abnormality (MA's, irregular pigmented choroidal lesion at 0300 midzone, just outside of macula).   Notes **Images stored on drive**  Impression: Mild NPDR OU -- Scattered IRH/DBH OU OS: irregular pigmented choroidal lesion at 0300 midzone, just outside of macula, no orange pigment or SRF                  ASSESSMENT/PLAN:    ICD-10-CM   1. Choroidal nevus of left eye  D31.32 Color Fundus Photography Optos - OU - Both Eyes  2. Mild nonproliferative  diabetic retinopathy of both eyes without macular edema associated with type 2 diabetes mellitus ([place])  D92.4268   3. Retinal edema  H35.81 OCT, Retina - OU - Both Eyes  4. Essential hypertension  I10   5. Hypertensive retinopathy of both eyes  H35.033   6. Combined forms of age-related cataract of both eyes  H25.813     1. Choroidal Nevus, OS  - irregular pigmented lesion at 0300 midzone, just outside macula (1Vx1.5H DD)  - no visual symptoms, SRF or orange pigment  - no elevation; thickness < 50mm  - discussed findings, prognosis  - recommend monitoring  - f/u 4-6 months, DFE, OCT, possible FA  2,3. Mild nonproliferative diabetic retinopathy w/o DME, OU - The incidence, risk factors for progression, natural history and treatment options for diabetic retinopathy were discussed with patient.   - The need for close monitoring of blood glucose, blood pressure, and serum lipids, avoiding cigarette or any type of tobacco, and the need for long term follow up was also discussed with patient. - exam shows scattered MA/IRH OU - OCT without diabetic macular edema, both eyes  - f/u in 4-6 mos -- DFE/OCT, possible FA  4,5. Hypertensive retinopathy OU - discussed importance of tight BP control - monitor  6. Mixed Cataract OU - The symptoms of cataract, surgical options, and treatments and risks were discussed with patient. - discussed diagnosis and progression - not yet visually significant - monitor for now   Ophthalmic Meds Ordered this visit:  No orders of the defined types were placed in this encounter.      Return for f/u 4-6 months nevus OS / NPDR OU.  There are no Patient Instructions on file for this visit.   Explained the diagnoses, plan, and follow up with the patient and they expressed understanding.  Patient expressed understanding of the importance of proper follow up  care.   This document serves as a record of services personally performed by Gardiner Sleeper, MD, PhD.  It was created on their behalf by Leonie Douglas, an ophthalmic technician. The creation of this record is the provider's dictation and/or activities during the visit.    Electronically signed by: Leonie Douglas COA, 02/26/20  3:50 PM   This document serves as a record of services personally performed by Gardiner Sleeper, MD, PhD. It was created on their behalf by San Jetty. Owens Shark, OA an ophthalmic technician. The creation of this record is the provider's dictation and/or activities during the visit.    Electronically signed by: San Jetty. Owens Shark, [place] 08.09.2021 3:50 PM   Gardiner Sleeper, M.D., Ph.D. Diseases & Surgery of the Retina and Vitreous [facility]  I have reviewed the above documentation for accuracy and completeness, and I agree with the above. Gardiner Sleeper, M.D., Ph.D. 02/26/20 3:50 PM   Abbreviations: M myopia (nearsighted); A astigmatism; H hyperopia (farsighted); P presbyopia; Mrx spectacle prescription;  CTL contact lenses; OD right eye; OS left eye; OU both eyes  XT exotropia; ET esotropia; PEK punctate epithelial keratitis; PEE punctate epithelial erosions; DES dry eye syndrome; MGD meibomian gland dysfunction; ATs artificial tears; PFAT's preservative free artificial tears; [place] nuclear sclerotic cataract; PSC posterior subcapsular cataract; ERM epi-retinal membrane; PVD posterior vitreous detachment; RD retinal detachment; DM diabetes mellitus; DR diabetic retinopathy; NPDR non-proliferative diabetic retinopathy; PDR proliferative diabetic retinopathy; CSME clinically significant macular edema; DME diabetic macular edema; dbh dot blot hemorrhages; CWS cotton wool spot; POAG primary open angle glaucoma; C/D cup-to-disc ratio; HVF humphrey visual field; GVF goldmann visual field; OCT optical coherence tomography; IOP intraocular pressure; BRVO Branch retinal vein occlusion; CRVO central retinal vein occlusion; CRAO central retinal artery occlusion; BRAO branch  retinal artery occlusion; RT retinal tear; SB scleral buckle; PPV pars plana vitrectomy; VH Vitreous hemorrhage; PRP panretinal laser photocoagulation; IVK intravitreal kenalog; VMT vitreomacular traction; MH Macular hole;  NVD neovascularization of the disc; NVE neovascularization elsewhere; AREDS age related eye disease study; ARMD age related macular degeneration; POAG primary open angle glaucoma; EBMD epithelial/anterior basement membrane dystrophy; ACIOL anterior chamber intraocular lens; IOL intraocular lens; PCIOL posterior chamber intraocular lens; Phaco/IOL phacoemulsification with intraocular lens placement; Berry photorefractive keratectomy; LASIK laser assisted in situ keratomileusis; HTN hypertension; DM diabetes mellitus; COPD chronic obstructive pulmonary disease

## 2020-02-26 ENCOUNTER — Other Ambulatory Visit: Payer: Self-pay

## 2020-02-26 ENCOUNTER — Ambulatory Visit (INDEPENDENT_AMBULATORY_CARE_PROVIDER_SITE_OTHER): Payer: Medicaid Other | Admitting: Ophthalmology

## 2020-02-26 ENCOUNTER — Encounter (INDEPENDENT_AMBULATORY_CARE_PROVIDER_SITE_OTHER): Payer: Self-pay | Admitting: Ophthalmology

## 2020-02-26 DIAGNOSIS — H3581 Retinal edema: Secondary | ICD-10-CM | POA: Diagnosis not present

## 2020-02-26 DIAGNOSIS — H25813 Combined forms of age-related cataract, bilateral: Secondary | ICD-10-CM

## 2020-02-26 DIAGNOSIS — H35033 Hypertensive retinopathy, bilateral: Secondary | ICD-10-CM

## 2020-02-26 DIAGNOSIS — E113293 Type 2 diabetes mellitus with mild nonproliferative diabetic retinopathy without macular edema, bilateral: Secondary | ICD-10-CM | POA: Diagnosis not present

## 2020-02-26 DIAGNOSIS — I10 Essential (primary) hypertension: Secondary | ICD-10-CM

## 2020-02-26 DIAGNOSIS — D3132 Benign neoplasm of left choroid: Secondary | ICD-10-CM | POA: Diagnosis not present

## 2020-07-24 ENCOUNTER — Other Ambulatory Visit: Payer: Medicaid Other

## 2020-07-24 DIAGNOSIS — Z20822 Contact with and (suspected) exposure to covid-19: Secondary | ICD-10-CM

## 2020-07-25 NOTE — Progress Notes (Shared)
[facility] - Clinic Note  07/29/2020     CHIEF COMPLAINT Patient presents for No chief complaint on file.   HISTORY OF PRESENT ILLNESS: Michele Shaw is a 55 y.o. female who presents to the clinic today for:   pt is here on the referral of Dr. Hanley Seamen for concern of choroidal nevus OS, pt endorses being diabetic and hypertensive, but denies any hx of cancer, pt states years ago her eye dr in [place] told her about the nevus, but said it was nothing to worry about  Referring physician: Wynona Luna, OD [address] [place],  [place] [address]   HISTORICAL INFORMATION:   Selected notes from the MEDICAL RECORD NUMBER Referred by Dr. Hanley Seamen LEE: 06.29.21 BCVA: OD 20/60-1, OS 20/100+1 Ocular Hx- Choroidal Nevus OS, NPDR OU, cataracts OU, gl suspect OU.      CURRENT MEDICATIONS: Current Outpatient Medications (Ophthalmic Drugs)  Medication Sig  . azelastine (OPTIVAR) 0.05 % ophthalmic solution   . PAZEO 0.7 % SOLN INSTILL 1 DROP INTO EACH EYE IN THE MORNING  . RESTASIS 0.05 % ophthalmic emulsion 1 drop 2 (two) times daily.   No current facility-administered medications for this visit. (Ophthalmic Drugs)   Current Outpatient Medications (Other)  Medication Sig  . acyclovir (ZOVIRAX) 400 MG tablet Take 400 mg by mouth 2 (two) times daily.  Marland Kitchen albuterol (VENTOLIN HFA) 108 (90 Base) MCG/ACT inhaler SMARTSIG:2-4 Puff(s) By Mouth Every 4 Hours PRN  . LEVEMIR FLEXTOUCH 100 UNIT/ML Pen INJECT 15 UNITS SUBCUTANEOUSLY ONCE DAILY AT BEDTIME  . lisinopril (ZESTRIL) 10 MG tablet Take 10 mg by mouth daily.  Marland Kitchen lisinopril-hydrochlorothiazide (PRINZIDE,ZESTORETIC) 20-25 MG tablet Take 1 tablet by mouth daily. (Patient not taking: Reported on 02/26/2020)  . metFORMIN (GLUCOPHAGE) 500 MG tablet Take by mouth.  Marland Kitchen omeprazole (PRILOSEC) 20 MG capsule Take 1 capsule (20 mg total) by mouth daily.  Marland Kitchen RELION PEN NEEDLES 31G X 6 MM MISC USE 1 TWICE DAILY  . sertraline  (ZOLOFT) 100 MG tablet Take 150 mg by mouth daily.  . sertraline (ZOLOFT) 50 MG tablet Take 3 tablets (150 mg total) by mouth daily.  . simvastatin (ZOCOR) 20 MG tablet TAKE 1 TABLET BY MOUTH ONCE DAILY IN THE EVENING FOR 90 DAYS  . TRADJENTA 5 MG TABS tablet Take 5 mg by mouth daily.   No current facility-administered medications for this visit. (Other)      REVIEW OF SYSTEMS:    ALLERGIES Allergies  Allergen Reactions  . Aspirin Anaphylaxis    PAST MEDICAL HISTORY Past Medical History:  Diagnosis Date  . Anxiety   . Diabetes mellitus without complication (HCC)   . GERD (gastroesophageal reflux disease)   . Hypertension    Past Surgical History:  Procedure Laterality Date  . CESAREAN SECTION      FAMILY HISTORY Family History  Problem Relation Age of Onset  . Hypertension Mother   . Hypertension Father     SOCIAL HISTORY Social History   Tobacco Use  . Smoking status: Never Smoker  . Smokeless tobacco: Never Used  Vaping Use  . Vaping Use: Never used  Substance Use Topics  . Alcohol use: No  . Drug use: No         OPHTHALMIC EXAM:  Not recorded     IMAGING AND PROCEDURES  Imaging and Procedures for 07/29/2020           ASSESSMENT/PLAN:    ICD-10-CM   1. Choroidal nevus of  left eye  D31.32   2. Mild nonproliferative diabetic retinopathy of both eyes without macular edema associated with type 2 diabetes mellitus ([place])  [id number]   3. Retinal edema  H35.81   4. Essential hypertension  I10   5. Hypertensive retinopathy of both eyes  H35.033   6. Combined forms of age-related cataract of both eyes  H25.813     1. Choroidal Nevus, OS  - irregular pigmented lesion at 0300 midzone, just outside macula (1Vx1.5H DD)  - no visual symptoms, SRF or orange pigment  - no elevation; thickness < 56mm  - discussed findings, prognosis  - recommend monitoring  - f/u 4-6 months, DFE, OCT, possible FA  2,3. Mild nonproliferative diabetic retinopathy  w/o DME, OU - The incidence, risk factors for progression, natural history and treatment options for diabetic retinopathy were discussed with patient.   - The need for close monitoring of blood glucose, blood pressure, and serum lipids, avoiding cigarette or any type of tobacco, and the need for long term follow up was also discussed with patient. - exam shows scattered MA/IRH OU - OCT without diabetic macular edema, both eyes  - f/u in 4-6 mos -- DFE/OCT, possible FA  4,5. Hypertensive retinopathy OU - discussed importance of tight BP control - monitor  6. Mixed Cataract OU - The symptoms of cataract, surgical options, and treatments and risks were discussed with patient. - discussed diagnosis and progression - not yet visually significant - monitor for now   Ophthalmic Meds Ordered this visit:  No orders of the defined types were placed in this encounter.      No follow-ups on file.  There are no Patient Instructions on file for this visit.  This document serves as a record of services personally performed by Gardiner Sleeper, MD, PhD. It was created on their behalf by Leeann Must, [place], an ophthalmic technician. The creation of this record is the provider's dictation and/or activities during the visit.    Electronically signed by: Leeann Must, COA @TODAY @ 10:29 AM   Abbreviations: M myopia (nearsighted); A astigmatism; H hyperopia (farsighted); P presbyopia; Mrx spectacle prescription;  CTL contact lenses; OD right eye; OS left eye; OU both eyes  XT exotropia; ET esotropia; PEK punctate epithelial keratitis; PEE punctate epithelial erosions; DES dry eye syndrome; MGD meibomian gland dysfunction; ATs artificial tears; PFAT's preservative free artificial tears; [place] nuclear sclerotic cataract; PSC posterior subcapsular cataract; ERM epi-retinal membrane; PVD posterior vitreous detachment; RD retinal detachment; DM diabetes mellitus; DR diabetic retinopathy; NPDR  non-proliferative diabetic retinopathy; PDR proliferative diabetic retinopathy; CSME clinically significant macular edema; DME diabetic macular edema; dbh dot blot hemorrhages; CWS cotton wool spot; POAG primary open angle glaucoma; C/D cup-to-disc ratio; HVF humphrey visual field; GVF goldmann visual field; OCT optical coherence tomography; IOP intraocular pressure; BRVO Branch retinal vein occlusion; CRVO central retinal vein occlusion; CRAO central retinal artery occlusion; BRAO branch retinal artery occlusion; RT retinal tear; SB scleral buckle; PPV pars plana vitrectomy; VH Vitreous hemorrhage; PRP panretinal laser photocoagulation; IVK intravitreal kenalog; VMT vitreomacular traction; MH Macular hole;  NVD neovascularization of the disc; NVE neovascularization elsewhere; AREDS age related eye disease study; ARMD age related macular degeneration; POAG primary open angle glaucoma; EBMD epithelial/anterior basement membrane dystrophy; ACIOL anterior chamber intraocular lens; IOL intraocular lens; PCIOL posterior chamber intraocular lens; Phaco/IOL phacoemulsification with intraocular lens placement; [place] photorefractive keratectomy; LASIK laser assisted in situ keratomileusis; HTN hypertension; DM diabetes mellitus; COPD chronic obstructive pulmonary disease

## 2020-07-26 LAB — NOVEL CORONAVIRUS, NAA: SARS-CoV-2, NAA: NOT DETECTED

## 2020-07-26 LAB — SARS-COV-2, NAA 2 DAY TAT

## 2020-07-29 ENCOUNTER — Encounter (INDEPENDENT_AMBULATORY_CARE_PROVIDER_SITE_OTHER): Payer: Medicaid Other | Admitting: Ophthalmology

## 2020-07-29 DIAGNOSIS — H25813 Combined forms of age-related cataract, bilateral: Secondary | ICD-10-CM

## 2020-07-29 DIAGNOSIS — I10 Essential (primary) hypertension: Secondary | ICD-10-CM

## 2020-07-29 DIAGNOSIS — H35033 Hypertensive retinopathy, bilateral: Secondary | ICD-10-CM

## 2020-07-29 DIAGNOSIS — D3132 Benign neoplasm of left choroid: Secondary | ICD-10-CM

## 2020-07-29 DIAGNOSIS — E113293 Type 2 diabetes mellitus with mild nonproliferative diabetic retinopathy without macular edema, bilateral: Secondary | ICD-10-CM

## 2020-07-29 DIAGNOSIS — H3581 Retinal edema: Secondary | ICD-10-CM

## 2020-12-04 ENCOUNTER — Encounter (HOSPITAL_COMMUNITY): Payer: Self-pay

## 2020-12-04 ENCOUNTER — Other Ambulatory Visit: Payer: Self-pay

## 2020-12-04 ENCOUNTER — Emergency Department (HOSPITAL_COMMUNITY)
Admission: EM | Admit: 2020-12-04 | Discharge: 2020-12-04 | Disposition: A | Discharge status: Home / Self Care | Payer: Medicaid Other | Attending: Emergency Medicine | Admitting: Emergency Medicine

## 2020-12-04 DIAGNOSIS — R739 Hyperglycemia, unspecified: Secondary | ICD-10-CM | POA: Diagnosis present

## 2020-12-04 DIAGNOSIS — I10 Essential (primary) hypertension: Secondary | ICD-10-CM | POA: Insufficient documentation

## 2020-12-04 DIAGNOSIS — E1165 Type 2 diabetes mellitus with hyperglycemia: Secondary | ICD-10-CM | POA: Insufficient documentation

## 2020-12-04 DIAGNOSIS — Z7984 Long term (current) use of oral hypoglycemic drugs: Secondary | ICD-10-CM | POA: Diagnosis not present

## 2020-12-04 DIAGNOSIS — Z79899 Other long term (current) drug therapy: Secondary | ICD-10-CM | POA: Insufficient documentation

## 2020-12-04 LAB — CBC WITH DIFFERENTIAL/PLATELET
Abs Immature Granulocytes: 0.02 10*3/uL (ref 0.00–0.07)
Basophils Absolute: 0 10*3/uL (ref 0.0–0.1)
Basophils Relative: 1 %
Eosinophils Absolute: 0.2 10*3/uL (ref 0.0–0.5)
Eosinophils Relative: 3 %
HCT: 36.4 % (ref 36.0–46.0)
Hemoglobin: 12.6 g/dL (ref 12.0–15.0)
Immature Granulocytes: 0 %
Lymphocytes Relative: 25 %
Lymphs Abs: 1.5 10*3/uL (ref 0.7–4.0)
MCH: 29.6 pg (ref 26.0–34.0)
MCHC: 34.6 g/dL (ref 30.0–36.0)
MCV: 85.6 fL (ref 80.0–100.0)
Monocytes Absolute: 0.4 10*3/uL (ref 0.1–1.0)
Monocytes Relative: 6 %
Neutro Abs: 4.1 10*3/uL (ref 1.7–7.7)
Neutrophils Relative %: 65 %
Platelets: 208 10*3/uL (ref 150–400)
RBC: 4.25 MIL/uL (ref 3.87–5.11)
RDW: 11.7 % (ref 11.5–15.5)
WBC: 6.2 10*3/uL (ref 4.0–10.5)
nRBC: 0 % (ref 0.0–0.2)

## 2020-12-04 LAB — CBG MONITORING, ED
Glucose-Capillary: 576 mg/dL (ref 70–99)
Glucose-Capillary: 278 mg/dL — ABNORMAL HIGH (ref 70–99)

## 2020-12-04 LAB — COMPREHENSIVE METABOLIC PANEL
ALT: 22 U/L (ref 0–44)
AST: 20 U/L (ref 15–41)
Albumin: 4.3 g/dL (ref 3.5–5.0)
Alkaline Phosphatase: 62 U/L (ref 38–126)
Anion gap: 13 (ref 5–15)
BUN: 25 mg/dL — ABNORMAL HIGH (ref 6–20)
CO2: 22 mmol/L (ref 22–32)
Calcium: 9.2 mg/dL (ref 8.9–10.3)
Chloride: 95 mmol/L — ABNORMAL LOW (ref 98–111)
Creatinine, Ser: 1.29 mg/dL — ABNORMAL HIGH (ref 0.44–1.00)
GFR, Estimated: 50 mL/min — ABNORMAL LOW (ref >60)
Glucose, Bld: 541 mg/dL (ref 70–99)
Potassium: 4.5 mmol/L (ref 3.5–5.1)
Sodium: 130 mmol/L — ABNORMAL LOW (ref 135–145)
Total Bilirubin: 0.6 mg/dL (ref 0.3–1.2)
Total Protein: 8 g/dL (ref 6.5–8.1)

## 2020-12-04 LAB — BETA-HYDROXYBUTYRIC ACID: Beta-Hydroxybutyric Acid: 0.1 mmol/L (ref 0.05–0.27)

## 2020-12-04 MED ORDER — SODIUM CHLORIDE 0.9 % IV BOLUS
1000.0000 mL | Freq: Once | INTRAVENOUS | Status: AC
  Administered 2020-12-04: 1000 mL via INTRAVENOUS

## 2020-12-04 MED ORDER — INSULIN ASPART 100 UNIT/ML IJ SOLN
15.0000 [IU] | Freq: Once | INTRAMUSCULAR | Status: AC
  Administered 2020-12-04: 15 [IU] via SUBCUTANEOUS
  Filled 2020-12-04: qty 0.15

## 2020-12-04 NOTE — ED Notes (Signed)
CBG=278 

## 2020-12-04 NOTE — Discharge Instructions (Addendum)
You were hyperglycemic in the 500s, but the remainder of your laboratory work-up was very reassuring.  I am glad that you are feeling improved after fluid resuscitation and insulin here in the ED.  Please take your medications at home, as directed.  I need to follow-up with your primary care provider this afternoon to schedule appointment for close outpatient follow-up.  I hope you can find a more sustainable plan for your combination of insulin and oral antihyperglycemic medications.  I have also provided you an ambulatory referral to a nutrition and diabetic education specialist in the [place] area.  They will call you to schedule an appointment, if you would like.  Please continue to check your sugars regularly.  Return to the ER or seek immediate medical attention if any new or worsening symptoms.

## 2020-12-04 NOTE — ED Provider Notes (Signed)
[facility] DEPT Provider Note   CSN: [id number] Arrival date & time: 12/04/20  1040     History Chief Complaint  Patient presents with  . Hyperglycemia    Michele Shaw is a 55 y.o. female with PMH of HTN, HLD, GERD, and insulin-dependent type II DM who presents the ED sent from primary care provider for hyperglycemia.  On my examination, patient reports that she feels lightheaded and weak intermittently with blurred vision x months.  She states that the symptoms come and go sporadically.  She states that her blood sugars are poorly controlled.  Sometimes she is hyperglycemic, sometimes she is hyperglycemic.  She has recently had her insulin adjusted from 24 units to 30 units each morning.    Patient also states that she is taking oral antihyperglycemic's.  She was started recently on Farxiga, but only has a sample.  She also was prescribed Tradjenta, but states that it had not arrived to her pharmacy.  She is not taking her oral medications diligently.  She states that she had not taken it yesterday before getting her blood drawn and has not had any of her antihyperglycemic medications yet today.    Aside from generalized weakness and intermittently blurred vision, she denies any other symptoms.  No fevers, chills, chest pain or shortness of breath, numbness or weakness, nausea or vomiting, abdominal pain, or any other symptoms.  She states that she was just seeing her primary care provider for scheduled lab work, she was otherwise asymptomatic  HPI     Past Medical History:  Diagnosis Date  . Anxiety   . Diabetes mellitus without complication ([place])   . GERD (gastroesophageal reflux disease)   . Hypertension     Patient Active Problem List   Diagnosis Date Noted  . Syncope 08/16/2015    Past Surgical History:  Procedure Laterality Date  . CESAREAN SECTION       OB History   No obstetric history on file.     Family History  Problem  Relation Age of Onset  . Hypertension Mother   . Hypertension Father     Social History   Tobacco Use  . Smoking status: Never Smoker  . Smokeless tobacco: Never Used  Vaping Use  . Vaping Use: Never used  Substance Use Topics  . Alcohol use: No  . Drug use: No    Home Medications Prior to Admission medications   Medication Sig Start Date End Date Taking? Authorizing Provider  acyclovir (ZOVIRAX) 400 MG tablet Take 400 mg by mouth 2 (two) times daily. 12/25/18   [provider]  albuterol (VENTOLIN HFA) 108 (90 Base) MCG/ACT inhaler SMARTSIG:2-4 Puff(s) By Mouth Every 4 Hours PRN 09/23/19   [provider]  azelastine (OPTIVAR) 0.05 % ophthalmic solution  02/12/20   [provider]  LEVEMIR FLEXTOUCH 100 UNIT/ML Pen INJECT 15 UNITS SUBCUTANEOUSLY ONCE DAILY AT BEDTIME 01/23/19   [provider]  lisinopril (ZESTRIL) 10 MG tablet Take 10 mg by mouth daily.    [provider]  lisinopril-hydrochlorothiazide (PRINZIDE,ZESTORETIC) 20-25 MG tablet Take 1 tablet by mouth daily. Patient not taking: Reported on 02/26/2020 11/30/15   Leo Grosser, MD  metFORMIN (GLUCOPHAGE) 500 MG tablet Take by mouth. 11/30/15   [provider]  omeprazole (PRILOSEC) 20 MG capsule Take 1 capsule (20 mg total) by mouth daily. 08/16/15   [name], MD  omeprazole (PRILOSEC) 40 MG capsule Take 40 mg by mouth daily. 12/03/20   [provider]  PAZEO 0.7 % SOLN INSTILL 1 DROP INTO EACH EYE IN THE MORNING 01/17/19   [provider]  RELION PEN NEEDLES 31G X 6 MM MISC USE 1 TWICE DAILY 01/29/20   [provider]  RESTASIS 0.05 % ophthalmic emulsion 1 drop 2 (two) times daily. 01/16/20   [provider]  sertraline (ZOLOFT) 100 MG tablet Take 150 mg by mouth daily. 02/04/20   [provider]  sertraline (ZOLOFT) 50 MG tablet Take 3 tablets (150 mg total) by mouth daily. 08/16/15   [name], MD  simvastatin  (ZOCOR) 20 MG tablet TAKE 1 TABLET BY MOUTH ONCE DAILY IN THE EVENING FOR 90 DAYS 01/14/19   [provider]  TRADJENTA 5 MG TABS tablet Take 5 mg by mouth daily. 01/23/19   [provider]    Allergies    Aspirin  Review of Systems   Review of Systems  All other systems reviewed and are negative.   Physical Exam Updated Vital Signs BP (!) 140/106   Pulse 93   Temp 98.4 F (36.9 C) (Oral)   Resp 17   Ht 5\' 7"  (1.702 m)   Wt 85.7 kg   SpO2 96%   BMI 29.60 kg/m   Physical Exam Vitals and nursing note reviewed. Exam conducted with a chaperone present.  Constitutional:      General: She is not in acute distress.    Appearance: Normal appearance. She is not ill-appearing.  HENT:     Head: Normocephalic and atraumatic.  Eyes:     General: No scleral icterus.    Conjunctiva/sclera: Conjunctivae normal.  Cardiovascular:     Rate and Rhythm: Normal rate.     Pulses: Normal pulses.  Pulmonary:     Effort: Pulmonary effort is normal. No respiratory distress.     Breath sounds: Normal breath sounds.  Abdominal:     General: Abdomen is flat. There is no distension.     Palpations: Abdomen is soft.     Tenderness: There is no abdominal tenderness.  Skin:    General: Skin is dry.  Neurological:     Mental Status: She is alert and oriented to person, place, and time.     GCS: GCS eye subscore is 4. GCS verbal subscore is 5. GCS motor subscore is 6.  Psychiatric:        Mood and Affect: Mood normal.        Behavior: Behavior normal.        Thought Content: Thought content normal.     ED Results / Procedures / Treatments   Labs (all labs ordered are listed, but only abnormal results are displayed) Labs Reviewed  COMPREHENSIVE METABOLIC PANEL - Abnormal; Notable for the following components:      Result Value   Sodium 130 (*)    Chloride 95 (*)    Glucose, Bld 541 (*)    BUN 25 (*)    Creatinine, Ser 1.29 (*)    GFR, Estimated 50 (*)    All other  components within normal limits  CBG MONITORING, ED - Abnormal; Notable for the following components:   Glucose-Capillary 576 (*)    All other components within normal limits  CBG MONITORING, ED - Abnormal; Notable for the following components:   Glucose-Capillary 278 (*)    All other components within normal limits  CBC WITH DIFFERENTIAL/PLATELET  BETA-HYDROXYBUTYRIC ACID  URINALYSIS, ROUTINE W REFLEX MICROSCOPIC    EKG None  Radiology No results  found.  Procedures Procedures   Medications Ordered in ED Medications  sodium chloride 0.9 % bolus 1,000 mL (1,000 mLs Intravenous New Bag/Given 12/04/20 1250)  insulin aspart (novoLOG) injection 15 Units (15 Units Subcutaneous Given 12/04/20 1252)    ED Course  I have reviewed the triage vital signs and the nursing notes.  Pertinent labs & imaging results that were available during my care of the patient were reviewed by me and considered in my medical decision making (see chart for details).    MDM Rules/Calculators/A&P                          Quinta Eimer was evaluated in Emergency Department on 12/04/2020 for the symptoms described in the history of present illness. She was evaluated in the context of the global COVID-19 pandemic, which necessitated consideration that the patient might be at risk for infection with the SARS-CoV-2 virus that causes COVID-19. Institutional protocols and algorithms that pertain to the evaluation of patients at risk for COVID-19 are in a state of rapid change based on information released by regulatory bodies including the CDC and federal and state organizations. These policies and algorithms were followed during the patient's care in the ED.  I personally reviewed patient's medical chart and all notes from triage and staff during today's encounter. I have also ordered and reviewed all labs and imaging that I felt to be medically necessary in the evaluation of this patient's complaints and with  consideration of their physical exam. If needed, translation services were available and utilized.   Patient with generalized weakness, thirst, and intermittently blurred vision consistent with her hyperglycemia elevated to 576 here in the ED.  Laboratory work-up is otherwise reassuring.  Mild kidney impairment, likely prerenal azotemia in the setting of her hyperglycemia and dehydration.  Will place with 2 L IV NS.  We will also provide 15 units subcutaneous insulin.  She will need to resume her typical antihyperglycemic regimen.  We will consult diabetes coordinator to speak with patient given her poorly controlled blood sugars.  Nothing concerning for DKA or HHS.  On subsequent evaluation, patient states that she feels much improved and is prepared for discharge.  We will try to help facilitate outpatient follow-up with diabetic coordinator.  She plans to call her primary care provider this afternoon regarding today's ED encounter and schedule an appointment for ongoing evaluation and management of her poorly controlled blood sugars.  ER return precautions discussed.  Patient voices understanding is agreeable to the plan.  Final Clinical Impression(s) / ED Diagnoses Final diagnoses:  Hyperglycemia    Rx / DC Orders ED Discharge Orders         Ordered    Ambulatory referral to Nutrition and Diabetic Education        12/04/20 [place], Liller Yohn L, PA-C 12/04/20 1457    Dorie Rank, MD 12/05/20 [phone]

## 2020-12-04 NOTE — ED Triage Notes (Signed)
Pt arrived via walk in, c/o hyperglycemia, CBG 400's yesterday, called PCP yesterday and was told to come to ED. Denies any n/v.   CBG 576 in triage.

## 2020-12-04 NOTE — Progress Notes (Signed)
Inpatient Diabetes Program Recommendations  AACE/ADA: New Consensus Statement on Inpatient Glycemic Control (2015)  Target Ranges:  Prepandial:   less than 140 mg/dL      Peak postprandial:   less than 180 mg/dL (1-2 hours)      Critically ill patients:  140 - 180 mg/dL   Lab Results  Component Value Date   GLUCAP 278 (H) 12/04/2020    Review of Glycemic Control  Diabetes history: DM2 Outpatient Diabetes medications: Levemir 15 units BID, glipizide 5 mg QAM Current orders for Inpatient glycemic control: Novolog 15 units IJ at 1300  [facility] PCP at [facility]   Inpatient Diabetes Program Recommendations:     Spoke with pt at bedside regarding her hyperglycemia and diabetes control at home. Pt states she did not take her insulin or glipizide for the past 2 days. Said she usually eats 1 meal/day and said she's under a lot of stress in past few weeks. States MD gave her sample of [place], but Medicaid will not pay for it. Pt said she monitors blood sugars several times/day and it's usually high. Admits to eating sugar, lots of rice, and "things she knows she shouldn't eat." No exercise. Discussed importance of controlling blood sugars to reduce risks of complications, both long and short-term. Discussed how exercise (walking) will help with blood sugar control and stress management. Gave menu options for eating 3 meals/day, variety of foods, veggies, lean meats and low fat dairy. Continue checking blood sugars and call PCP if they are running > 200 mg/dL on a daily basis. Discussed that pt may need rapid-acting insulin if post-prandials are elevated, but PCP would decide. I do believe this may be a good option if pt will try to eat 3 small meals/day. Answered questions. Pt did not know what her HgbA1C was. Instructed to f/u with PCP.  Thank you. Lorenda Peck, RD, LDN, CDE Inpatient Diabetes Coordinator [phone]

## 2020-12-04 NOTE — ED Notes (Signed)
DM coordinator at bedside, will dc pt shortly

## 2021-05-03 ENCOUNTER — Other Ambulatory Visit: Payer: Self-pay

## 2021-05-03 ENCOUNTER — Encounter (HOSPITAL_COMMUNITY): Payer: Self-pay | Admitting: Emergency Medicine

## 2021-05-03 ENCOUNTER — Emergency Department (HOSPITAL_COMMUNITY)
Admission: EM | Admit: 2021-05-03 | Discharge: 2021-05-03 | Disposition: A | Discharge status: Home / Self Care | Payer: Medicaid Other | Attending: Emergency Medicine | Admitting: Emergency Medicine

## 2021-05-03 DIAGNOSIS — Z7984 Long term (current) use of oral hypoglycemic drugs: Secondary | ICD-10-CM | POA: Insufficient documentation

## 2021-05-03 DIAGNOSIS — U071 COVID-19: Secondary | ICD-10-CM | POA: Insufficient documentation

## 2021-05-03 DIAGNOSIS — Z79899 Other long term (current) drug therapy: Secondary | ICD-10-CM | POA: Insufficient documentation

## 2021-05-03 DIAGNOSIS — Z794 Long term (current) use of insulin: Secondary | ICD-10-CM | POA: Insufficient documentation

## 2021-05-03 DIAGNOSIS — E119 Type 2 diabetes mellitus without complications: Secondary | ICD-10-CM | POA: Diagnosis not present

## 2021-05-03 DIAGNOSIS — I10 Essential (primary) hypertension: Secondary | ICD-10-CM | POA: Diagnosis not present

## 2021-05-03 DIAGNOSIS — J029 Acute pharyngitis, unspecified: Secondary | ICD-10-CM | POA: Diagnosis present

## 2021-05-03 MED ORDER — MOLNUPIRAVIR EUA 200MG CAPSULE: 4.0000 | ORAL_CAPSULE | Freq: Two times a day (BID) | ORAL | 0 refills | Status: DC

## 2021-05-03 MED ORDER — MOLNUPIRAVIR EUA 200MG CAPSULE: 4.0000 | ORAL_CAPSULE | Freq: Two times a day (BID) | ORAL | 0 refills | Status: AC

## 2021-05-03 MED ORDER — BENZONATATE 100 MG PO CAPS: 100.0000 mg | ORAL_CAPSULE | Freq: Three times a day (TID) | ORAL | 0 refills | Status: DC

## 2021-05-03 NOTE — Discharge Instructions (Signed)
Take the prescribed medication as directed.  Can use over the counter decongestants such as mucinex or similar to break up mucous. Do your best to quarantine away from others.  Will need to be out of work for at least 5 days. Make sure to stay hydrated, eat regularly.  Make sure to rest. Follow-up with your doctor. Return here for trouble breathing, chest pain, etc.

## 2021-05-03 NOTE — ED Provider Notes (Signed)
[facility] DEPT Provider Note   CSN: [id number] Arrival date & time: 05/03/21  0243     History Chief Complaint  Patient presents with   Covid Positive    Michele Shaw is a 55 y.o. female.  The history is provided by the patient and medical records.   55 y.o. F with hx of anxiety, DM, GERD, HTN, presenting to the ED positive COVID test.  Patient states she began feeling unwell Thursday, symptoms worsened yesterday.  Mostly she has had nasal congestion, sore throat, and cough.  She has not had any chest pain or shortness of breath.  She did have a fever yesterday but none since that time.  She denies any nausea or vomiting.  She is eating and drinking well.  She has not had any known sick contacts with COVID and always wears a mask at work.  She has been vaccinated with booster.  Past Medical History:  Diagnosis Date   Anxiety    Diabetes mellitus without complication ([place])    GERD (gastroesophageal reflux disease)    Hypertension     Patient Active Problem List   Diagnosis Date Noted   Syncope 08/16/2015    Past Surgical History:  Procedure Laterality Date   CESAREAN SECTION       OB History   No obstetric history on file.     Family History  Problem Relation Age of Onset   Hypertension Mother    Hypertension Father     Social History   Tobacco Use   Smoking status: Never   Smokeless tobacco: Never  Vaping Use   Vaping Use: Never used  Substance Use Topics   Alcohol use: No   Drug use: No    Home Medications Prior to Admission medications   Medication Sig Start Date End Date Taking? Authorizing Provider  acyclovir (ZOVIRAX) 400 MG tablet Take 400 mg by mouth 2 (two) times daily. 12/25/18   [provider]  albuterol (VENTOLIN HFA) 108 (90 Base) MCG/ACT inhaler SMARTSIG:2-4 Puff(s) By Mouth Every 4 Hours PRN 09/23/19   [provider]  azelastine (OPTIVAR) 0.05 % ophthalmic solution  02/12/20   [provider]  LEVEMIR FLEXTOUCH 100 UNIT/ML Pen INJECT 15 UNITS SUBCUTANEOUSLY ONCE DAILY AT BEDTIME 01/23/19   [provider]  lisinopril (ZESTRIL) 10 MG tablet Take 10 mg by mouth daily.    [provider]  lisinopril-hydrochlorothiazide (PRINZIDE,ZESTORETIC) 20-25 MG tablet Take 1 tablet by mouth daily. Patient not taking: Reported on 02/26/2020 11/30/15   Leo Grosser, MD  metFORMIN (GLUCOPHAGE) 500 MG tablet Take by mouth. 11/30/15   [provider]  omeprazole (PRILOSEC) 20 MG capsule Take 1 capsule (20 mg total) by mouth daily. 08/16/15   [name], MD  omeprazole (PRILOSEC) 40 MG capsule Take 40 mg by mouth daily. 12/03/20   [provider]  PAZEO 0.7 % SOLN INSTILL 1 DROP INTO EACH EYE IN THE MORNING 01/17/19   [provider]  RELION PEN NEEDLES 31G X 6 MM MISC USE 1 TWICE DAILY 01/29/20   [provider]  RESTASIS 0.05 % ophthalmic emulsion 1 drop 2 (two) times daily. 01/16/20   [provider]  sertraline (ZOLOFT) 100 MG tablet Take 150 mg by mouth daily. 02/04/20   [provider]  sertraline (ZOLOFT) 50 MG tablet Take 3 tablets (150 mg total) by mouth daily. 08/16/15   [name], MD  simvastatin (ZOCOR) 20 MG tablet TAKE 1 TABLET BY  MOUTH ONCE DAILY IN THE EVENING FOR 90 DAYS 01/14/19   [provider]  TRADJENTA 5 MG TABS tablet Take 5 mg by mouth daily. 01/23/19   [provider]    Allergies    Aspirin  Review of Systems   Review of Systems  HENT:  Positive for congestion and sore throat.   Respiratory:  Positive for cough.   All other systems reviewed and are negative.  Physical Exam Updated Vital Signs BP (!) 156/67 (BP Location: Right Arm)   Pulse 99   Temp 98.8 F (37.1 C) (Oral)   Resp 18   Ht 5\' 6"  (1.676 m)   Wt 83.9 kg   SpO2 97%   BMI 29.86 kg/m   Physical Exam Vitals and nursing note reviewed.  Constitutional:      Appearance: She is  well-developed.  HENT:     Head: Normocephalic and atraumatic.     Nose:     Comments: Sounds congested Eyes:     Conjunctiva/sclera: Conjunctivae normal.     Pupils: Pupils are equal, round, and reactive to light.  Cardiovascular:     Rate and Rhythm: Normal rate and regular rhythm.     Heart sounds: Normal heart sounds.  Pulmonary:     Effort: Pulmonary effort is normal. No respiratory distress.     Breath sounds: Normal breath sounds. No rhonchi.  Abdominal:     General: Bowel sounds are normal.     Palpations: Abdomen is soft.     Tenderness: There is no abdominal tenderness. There is no rebound.  Musculoskeletal:        General: Normal range of motion.     Cervical back: Normal range of motion.  Skin:    General: Skin is warm and dry.  Neurological:     Mental Status: She is alert and oriented to person, place, and time.    ED Results / Procedures / Treatments   Labs (all labs ordered are listed, but only abnormal results are displayed) Labs Reviewed - No data to display  EKG None  Radiology No results found.  Procedures Procedures   Medications Ordered in ED Medications - No data to display  ED Course  I have reviewed the triage vital signs and the nursing notes.  Pertinent labs & imaging results that were available during my care of the patient were reviewed by me and considered in my medical decision making (see chart for details).    MDM Rules/Calculators/A&P                           55 year old female presenting to the ED after 2 positive home COVID test.  She began having symptoms Thursday and worsened yesterday.  She is afebrile and nontoxic in appearance here.  She is not displaying any signs of respiratory distress.  She does have some congestion and a mild cough but lungs are clear without any noted wheezes or rhonchi.  She denies any chest pain or shortness of breath.  She brought her COVID test with her to the hospital, these are obviously  positive.  She is interested in starting antivirals due to her underlying diabetes which is reasonable.  Prescribed mulnipiravir and tessalon, can use OTC decongestants as well if needed.  Advised to quarantine as much as possible for at least 5 days, rest and hydrate.  Can follow-up with PCP.  Return here for new concerns.  Final Clinical Impression(s) / ED Diagnoses  Final diagnoses:  [id number]    Rx / DC Orders ED Discharge Orders          Ordered    molnupiravir EUA (LAGEVRIO) 200 mg CAPS capsule  2 times daily        05/03/21 0512    benzonatate (TESSALON) 100 MG capsule  Every 8 hours        05/03/21 0512             Larene Pickett, PA-C 05/03/21 0525    Maudie Flakes, MD 05/03/21 [phone]

## 2021-05-03 NOTE — ED Triage Notes (Signed)
Pt took 2 COVID tests tonight. They are positive. She brought them. She has a cough and diabetes.

## 2021-05-18 ENCOUNTER — Emergency Department (HOSPITAL_COMMUNITY): Payer: Medicaid Other

## 2021-05-18 ENCOUNTER — Encounter (HOSPITAL_COMMUNITY): Payer: Self-pay

## 2021-05-18 ENCOUNTER — Emergency Department (HOSPITAL_COMMUNITY)
Admission: EM | Admit: 2021-05-18 | Discharge: 2021-05-18 | Disposition: A | Discharge status: Home / Self Care | Payer: Medicaid Other | Attending: Emergency Medicine | Admitting: Emergency Medicine

## 2021-05-18 DIAGNOSIS — E119 Type 2 diabetes mellitus without complications: Secondary | ICD-10-CM | POA: Diagnosis not present

## 2021-05-18 DIAGNOSIS — I10 Essential (primary) hypertension: Secondary | ICD-10-CM | POA: Insufficient documentation

## 2021-05-18 DIAGNOSIS — R059 Cough, unspecified: Secondary | ICD-10-CM | POA: Diagnosis present

## 2021-05-18 DIAGNOSIS — Z7984 Long term (current) use of oral hypoglycemic drugs: Secondary | ICD-10-CM | POA: Insufficient documentation

## 2021-05-18 DIAGNOSIS — Z79899 Other long term (current) drug therapy: Secondary | ICD-10-CM | POA: Insufficient documentation

## 2021-05-18 DIAGNOSIS — Z7951 Long term (current) use of inhaled steroids: Secondary | ICD-10-CM | POA: Insufficient documentation

## 2021-05-18 DIAGNOSIS — J209 Acute bronchitis, unspecified: Secondary | ICD-10-CM | POA: Diagnosis not present

## 2021-05-18 DIAGNOSIS — Z8616 Personal history of COVID-19: Secondary | ICD-10-CM | POA: Diagnosis not present

## 2021-05-18 DIAGNOSIS — J40 Bronchitis, not specified as acute or chronic: Secondary | ICD-10-CM

## 2021-05-18 MED ORDER — ALBUTEROL SULFATE HFA 108 (90 BASE) MCG/ACT IN AERS
2.0000 | INHALATION_SPRAY | Freq: Four times a day (QID) | RESPIRATORY_TRACT | Status: DC
  Administered 2021-05-18: 2 via RESPIRATORY_TRACT
  Filled 2021-05-18: qty 6.7

## 2021-05-18 MED ORDER — PREDNISONE 20 MG PO TABS: 40.0000 mg | ORAL_TABLET | Freq: Every day | ORAL | 0 refills | Status: DC

## 2021-05-18 MED ORDER — PREDNISONE 20 MG PO TABS
60.0000 mg | ORAL_TABLET | ORAL | Status: AC
  Administered 2021-05-18: 60 mg via ORAL
  Filled 2021-05-18: qty 3

## 2021-05-18 MED ORDER — PREDNISONE 20 MG PO TABS
40.0000 mg | ORAL_TABLET | Freq: Every day | ORAL | 0 refills | Status: DC
Start: 2021-05-18 — End: 2024-03-20

## 2021-05-18 NOTE — Discharge Instructions (Addendum)
Please use the provided albuterol every 4 hours for the next 2 days.  You may then use it as needed for additional relief.  In addition, be sure to obtain and take your steroid prescription.  These tablets to be taken each of the next 4 mornings.  Return here for concerning changes in your condition.

## 2021-05-18 NOTE — ED Provider Notes (Signed)
[facility] DEPT Provider Note   CSN: [id number] Arrival date & time: 05/18/21  1144     History Chief Complaint  Patient presents with   Cough    Michele Shaw is a 55 y.o. female.  HPI Patient with history of COVID diagnosis 2 weeks ago presents with cough, wheezing, congestion.  She notes that her fever has resolved, she has no nausea, vomiting, diarrhea, no chest pain, no syncope.  Over the past 3 or 4 days she has had worsening symptoms, however. No relief with anything.  She has a history of bronchitis during prior illnesses, but no history of asthma, is a non-smoker.     Past Medical History:  Diagnosis Date   Anxiety    Diabetes mellitus without complication ([place])    GERD (gastroesophageal reflux disease)    Hypertension     Patient Active Problem List   Diagnosis Date Noted   Syncope 08/16/2015    Past Surgical History:  Procedure Laterality Date   CESAREAN SECTION       OB History   No obstetric history on file.     Family History  Problem Relation Age of Onset   Hypertension Mother    Hypertension Father     Social History   Tobacco Use   Smoking status: Never   Smokeless tobacco: Never  Vaping Use   Vaping Use: Never used  Substance Use Topics   Alcohol use: No   Drug use: No    Home Medications Prior to Admission medications   Medication Sig Start Date End Date Taking? Authorizing Provider  acyclovir (ZOVIRAX) 400 MG tablet Take 400 mg by mouth 2 (two) times daily. 12/25/18   [provider]  albuterol (VENTOLIN HFA) 108 (90 Base) MCG/ACT inhaler SMARTSIG:2-4 Puff(s) By Mouth Every 4 Hours PRN 09/23/19   [provider]  azelastine (OPTIVAR) 0.05 % ophthalmic solution  02/12/20   [provider]  benzonatate (TESSALON) 100 MG capsule Take 1 capsule (100 mg total) by mouth every 8 (eight) hours. 05/03/21   Larene Pickett, PA-C  LEVEMIR FLEXTOUCH 100 UNIT/ML Pen INJECT 15 UNITS  SUBCUTANEOUSLY ONCE DAILY AT BEDTIME 01/23/19   [provider]  lisinopril (ZESTRIL) 10 MG tablet Take 10 mg by mouth daily.    [provider]  lisinopril-hydrochlorothiazide (PRINZIDE,ZESTORETIC) 20-25 MG tablet Take 1 tablet by mouth daily. Patient not taking: Reported on 02/26/2020 11/30/15   Leo Grosser, MD  metFORMIN (GLUCOPHAGE) 500 MG tablet Take by mouth. 11/30/15   [provider]  omeprazole (PRILOSEC) 20 MG capsule Take 1 capsule (20 mg total) by mouth daily. 08/16/15   [name], MD  omeprazole (PRILOSEC) 40 MG capsule Take 40 mg by mouth daily. 12/03/20   [provider]  PAZEO 0.7 % SOLN INSTILL 1 DROP INTO EACH EYE IN THE MORNING 01/17/19   [provider]  RELION PEN NEEDLES 31G X 6 MM MISC USE 1 TWICE DAILY 01/29/20   [provider]  RESTASIS 0.05 % ophthalmic emulsion 1 drop 2 (two) times daily. 01/16/20   [provider]  sertraline (ZOLOFT) 100 MG tablet Take 150 mg by mouth daily. 02/04/20   [provider]  sertraline (ZOLOFT) 50 MG tablet Take 3 tablets (150 mg total) by mouth daily. 08/16/15   [name], MD  simvastatin (ZOCOR) 20 MG tablet TAKE 1 TABLET BY MOUTH ONCE DAILY IN THE EVENING FOR 90 DAYS 01/14/19   [provider]  Lady Gary  5 MG TABS tablet Take 5 mg by mouth daily. 01/23/19   [provider]    Allergies    Aspirin  Review of Systems   Review of Systems  Constitutional:        Per HPI, otherwise negative  HENT:         Per HPI, otherwise negative  Respiratory:         Per HPI, otherwise negative  Cardiovascular:        Per HPI, otherwise negative  Gastrointestinal:  Negative for vomiting.  Endocrine:       Negative aside from HPI  Genitourinary:        Neg aside from HPI   Musculoskeletal:        Per HPI, otherwise negative  Skin: Negative.   Neurological:  Negative for syncope.   Physical Exam Updated Vital Signs BP (!) 149/82 (BP  Location: Left Arm)   Pulse (!) 102   Temp 99 F (37.2 C) (Oral)   Resp 18   SpO2 98%   Physical Exam Vitals and nursing note reviewed.  Constitutional:      General: She is not in acute distress.    Appearance: She is well-developed.  HENT:     Head: Normocephalic and atraumatic.  Eyes:     Conjunctiva/sclera: Conjunctivae normal.  Cardiovascular:     Rate and Rhythm: Normal rate and regular rhythm.  Pulmonary:     Effort: Pulmonary effort is normal. No respiratory distress.     Breath sounds: No stridor. Examination of the right-upper field reveals wheezing. Wheezing present.  Abdominal:     General: There is no distension.  Skin:    General: Skin is warm and dry.  Neurological:     Mental Status: She is alert and oriented to person, place, and time.     Cranial Nerves: No cranial nerve deficit.    ED Results / Procedures / Treatments     Radiology DG Chest 2 View  Result Date: 05/18/2021 CLINICAL DATA:  Persistent cough. EXAM: CHEST - 2 VIEW COMPARISON:  09/14/2019 FINDINGS: The cardiac silhouette, mediastinal and hilar contours are within normal limits and stable. Mild age advanced calcification involving the aorta. Peribronchial thickening and increased interstitial markings suggesting bronchitis or interstitial pneumonitis. No focal infiltrates. Small bilateral pleural effusions. No pulmonary lesions. IMPRESSION: Bronchitis or interstitial pneumonitis but no focal infiltrates. Small bilateral pleural effusions. Electronically Signed   By: Marijo Sanes M.D.   On: 05/18/2021 12:52    Procedures Procedures   Medications Ordered in ED Medications - No data to display  ED Course  I have reviewed the triage vital signs and the nursing notes.  Pertinent labs & imaging results that were available during my care of the patient were reviewed by me and considered in my medical decision making (see chart for details). Adult female with recent history of COVID presents with  new cough, congestion, mild dyspnea.  Here she is awake, alert, afebrile.  X-ray reviewed, discussed, no pneumonia.  Some suspicion for bronchitis, which may be postinfectious versus idiopathic versus viral.  No evidence of bacteremia, sepsis.  Patient appropriate for initiation of bronchodilators, steroids, outpatient follow-up. Final Clinical Impression(s) / ED Diagnoses Final diagnoses:  Bronchitis     Carmin Muskrat, MD 05/18/21 1319

## 2021-05-18 NOTE — ED Triage Notes (Addendum)
Pt c/o unproductive cough that has persisted since being dx with COVID 2 weeks ago. Pt is concerned that she has bronchitis or PNA.

## 2021-06-02 ENCOUNTER — Other Ambulatory Visit: Payer: Self-pay | Admitting: Nurse Practitioner

## 2021-06-02 DIAGNOSIS — M5412 Radiculopathy, cervical region: Secondary | ICD-10-CM

## 2021-08-10 ENCOUNTER — Encounter (HOSPITAL_BASED_OUTPATIENT_CLINIC_OR_DEPARTMENT_OTHER): Payer: Self-pay | Admitting: Emergency Medicine

## 2021-08-10 ENCOUNTER — Other Ambulatory Visit: Payer: Self-pay

## 2021-08-10 ENCOUNTER — Emergency Department (HOSPITAL_BASED_OUTPATIENT_CLINIC_OR_DEPARTMENT_OTHER)
Admission: EM | Admit: 2021-08-10 | Discharge: 2021-08-10 | Disposition: A | Discharge status: Home / Self Care | Payer: Medicaid Other | Attending: Emergency Medicine | Admitting: Emergency Medicine

## 2021-08-10 DIAGNOSIS — R110 Nausea: Secondary | ICD-10-CM | POA: Insufficient documentation

## 2021-08-10 DIAGNOSIS — M5412 Radiculopathy, cervical region: Secondary | ICD-10-CM | POA: Diagnosis not present

## 2021-08-10 DIAGNOSIS — M25512 Pain in left shoulder: Secondary | ICD-10-CM | POA: Insufficient documentation

## 2021-08-10 DIAGNOSIS — Z7984 Long term (current) use of oral hypoglycemic drugs: Secondary | ICD-10-CM | POA: Diagnosis not present

## 2021-08-10 DIAGNOSIS — I10 Essential (primary) hypertension: Secondary | ICD-10-CM | POA: Insufficient documentation

## 2021-08-10 DIAGNOSIS — M79602 Pain in left arm: Secondary | ICD-10-CM | POA: Diagnosis present

## 2021-08-10 DIAGNOSIS — E119 Type 2 diabetes mellitus without complications: Secondary | ICD-10-CM | POA: Insufficient documentation

## 2021-08-10 NOTE — ED Triage Notes (Signed)
Pt reports she has been having ongoing L shoulder pain for some time. Saw PCP and orthopedist for this and was prescribed tramadol, but says that tramadol is too strong for her and makes her tiredness, lightheaded and nauseous when she takes it. Denies symptoms when not taking medication. Pt asking for prescription for alternative pain medication for shoulder pain.

## 2021-08-10 NOTE — Discharge Instructions (Signed)
Follow-up with your spine center as scheduled for this week.  Work note provided.  Continue your Voltaren topical cream.  Also recommend following with your primary care doctor for blood pressure checks.  They are high today.  This could be due to the arm pain.

## 2021-08-10 NOTE — ED Notes (Signed)
ED Provider at bedside. 

## 2021-08-10 NOTE — ED Provider Notes (Addendum)
[facility] EMERGENCY DEPARTMENT Provider Note   CSN: [id number] Arrival date & time: 08/10/21  0740     History  No chief complaint on file.   Michele Shaw is a 56 y.o. female.  Patient with longstanding left shoulder and left arm pain.  Followed by spine center.  She has follow-up this week.  Patient using Voltaren or topical was recently prescribed tramadol.  Makes her feel not well.  Patient has similar problems with hydrocodone and oxycodone.  Patient is wanting something to switch over to.  Patient has a history of diabetes and not a good candidate for Motrin orally or Naprosyn orally.  She also has a history of hypertension.  Prior notes seem to imply that it is a C6 distribution.      Home Medications Prior to Admission medications   Medication Sig Start Date End Date Taking? Authorizing Provider  acyclovir (ZOVIRAX) 400 MG tablet Take 400 mg by mouth 2 (two) times daily. 12/25/18   [provider]  albuterol (VENTOLIN HFA) 108 (90 Base) MCG/ACT inhaler SMARTSIG:2-4 Puff(s) By Mouth Every 4 Hours PRN 09/23/19   [provider]  azelastine (OPTIVAR) 0.05 % ophthalmic solution  02/12/20   [provider]  benzonatate (TESSALON) 100 MG capsule Take 1 capsule (100 mg total) by mouth every 8 (eight) hours. 05/03/21   Larene Pickett, PA-C  LEVEMIR FLEXTOUCH 100 UNIT/ML Pen INJECT 15 UNITS SUBCUTANEOUSLY ONCE DAILY AT BEDTIME 01/23/19   [provider]  lisinopril (ZESTRIL) 10 MG tablet Take 10 mg by mouth daily.    [provider]  lisinopril-hydrochlorothiazide (PRINZIDE,ZESTORETIC) 20-25 MG tablet Take 1 tablet by mouth daily. Patient not taking: Reported on 02/26/2020 11/30/15   Leo Grosser, MD  metFORMIN (GLUCOPHAGE) 500 MG tablet Take by mouth. 11/30/15   [provider]  omeprazole (PRILOSEC) 20 MG capsule Take 1 capsule (20 mg total) by mouth daily. 08/16/15   [name], MD  omeprazole (PRILOSEC) 40 MG  capsule Take 40 mg by mouth daily. 12/03/20   [provider]  PAZEO 0.7 % SOLN INSTILL 1 DROP INTO EACH EYE IN THE MORNING 01/17/19   [provider]  predniSONE (DELTASONE) 20 MG tablet Take 2 tablets (40 mg total) by mouth daily with breakfast. For the next four days 05/18/21   Carmin Muskrat, MD  RELION PEN NEEDLES 31G X 6 MM MISC USE 1 TWICE DAILY 01/29/20   [provider]  RESTASIS 0.05 % ophthalmic emulsion 1 drop 2 (two) times daily. 01/16/20   [provider]  sertraline (ZOLOFT) 100 MG tablet Take 150 mg by mouth daily. 02/04/20   [provider]  sertraline (ZOLOFT) 50 MG tablet Take 3 tablets (150 mg total) by mouth daily. 08/16/15   [name], MD  simvastatin (ZOCOR) 20 MG tablet TAKE 1 TABLET BY MOUTH ONCE DAILY IN THE EVENING FOR 90 DAYS 01/14/19   [provider]  TRADJENTA 5 MG TABS tablet Take 5 mg by mouth daily. 01/23/19   [provider]      Allergies    Aspirin    Review of Systems   Review of Systems  Constitutional:  Negative for chills and fever.  HENT:  Negative for ear pain and sore throat.   Eyes:  Negative for pain and visual disturbance.  Respiratory:  Negative for cough and shortness of breath.   Cardiovascular:  Negative for chest pain and palpitations.  Gastrointestinal:  Negative for abdominal pain and vomiting.  Genitourinary:  Negative for dysuria and hematuria.  Musculoskeletal:  Negative for arthralgias, back pain and neck stiffness.  Skin:  Negative for color change and rash.  Neurological:  Positive for weakness and numbness. Negative for seizures and syncope.  All other systems reviewed and are negative.  Physical Exam Updated Vital Signs There were no vitals taken for this visit. Physical Exam Vitals and nursing note reviewed.  Constitutional:      General: She is not in acute distress.    Appearance: Normal appearance. She is well-developed.  HENT:     Head:  Normocephalic and atraumatic.  Eyes:     Conjunctiva/sclera: Conjunctivae normal.     Pupils: Pupils are equal, round, and reactive to light.  Cardiovascular:     Rate and Rhythm: Normal rate and regular rhythm.     Heart sounds: No murmur heard. Pulmonary:     Effort: Pulmonary effort is normal. No respiratory distress.     Breath sounds: Normal breath sounds.  Abdominal:     Palpations: Abdomen is soft.     Tenderness: There is no abdominal tenderness.  Musculoskeletal:        General: No swelling or deformity.     Cervical back: Neck supple.     Comments: Patient's left upper extremity with good movement at the wrist fingers elbow and shoulder.  Patient states she is got some decreased sensation in the index and middle finger.  Radial pulses 2+.  Good cap refill.  Reasonable flexion to make a fist and opposition of thumb and little finger.  Patient seems to feel that there is some subtle weakness there.  Skin:    General: Skin is warm and dry.     Capillary Refill: Capillary refill takes less than 2 seconds.  Neurological:     Mental Status: She is alert and oriented to person, place, and time.     Sensory: Sensory deficit present.  Psychiatric:        Mood and Affect: Mood normal.    ED Results / Procedures / Treatments   Labs (all labs ordered are listed, but only abnormal results are displayed) Labs Reviewed - No data to display  EKG None  Radiology No results found.  Procedures Procedures    Medications Ordered in ED Medications - No data to display  ED Course/ Medical Decision Making/ A&P                           Medical Decision Making  Patient does not want to be switched to any other pain medicine.  And technically she already has a standing prescription for tramadol.  Did offer Zofran to help with the nausea secondary to the tramadol.  The patient just wants to stop the tramadol.  She will use her Voltaren topical.  Has follow-up with her spine center  this week.  We will give her a work note to take her out of work until they can see her.  They were planning on an MRI but patient has difficulty with claustrophobia.  No acute neurological changes here today.  Symptoms do seem to be consistent with a cervical radiculopathy.  Patient's vital signs are significant for hypertension.  Patient has a history of hypertension.  We will have her follow-up with [facility] for this.  Elevated blood pressure may be secondary somewhat to the pain as well.  Did review her recent visits with orthopedics and her spine center.  Final Clinical Impression(s) / ED Diagnoses Final diagnoses:  None    Rx / DC Orders ED Discharge Orders     None         Fredia Sorrow, MD 08/10/21 9437    Fredia Sorrow, MD 08/10/21 0052    Fredia Sorrow, MD 08/10/21 [phone]

## 2022-03-07 IMAGING — CR DG CHEST 2V
2 series · 2 of 2 positions shown · non-contrast
Comparison: 09/14/2019

CLINICAL DATA: Persistent cough.

EXAM:
CHEST - 2 VIEW

[w chest pa]
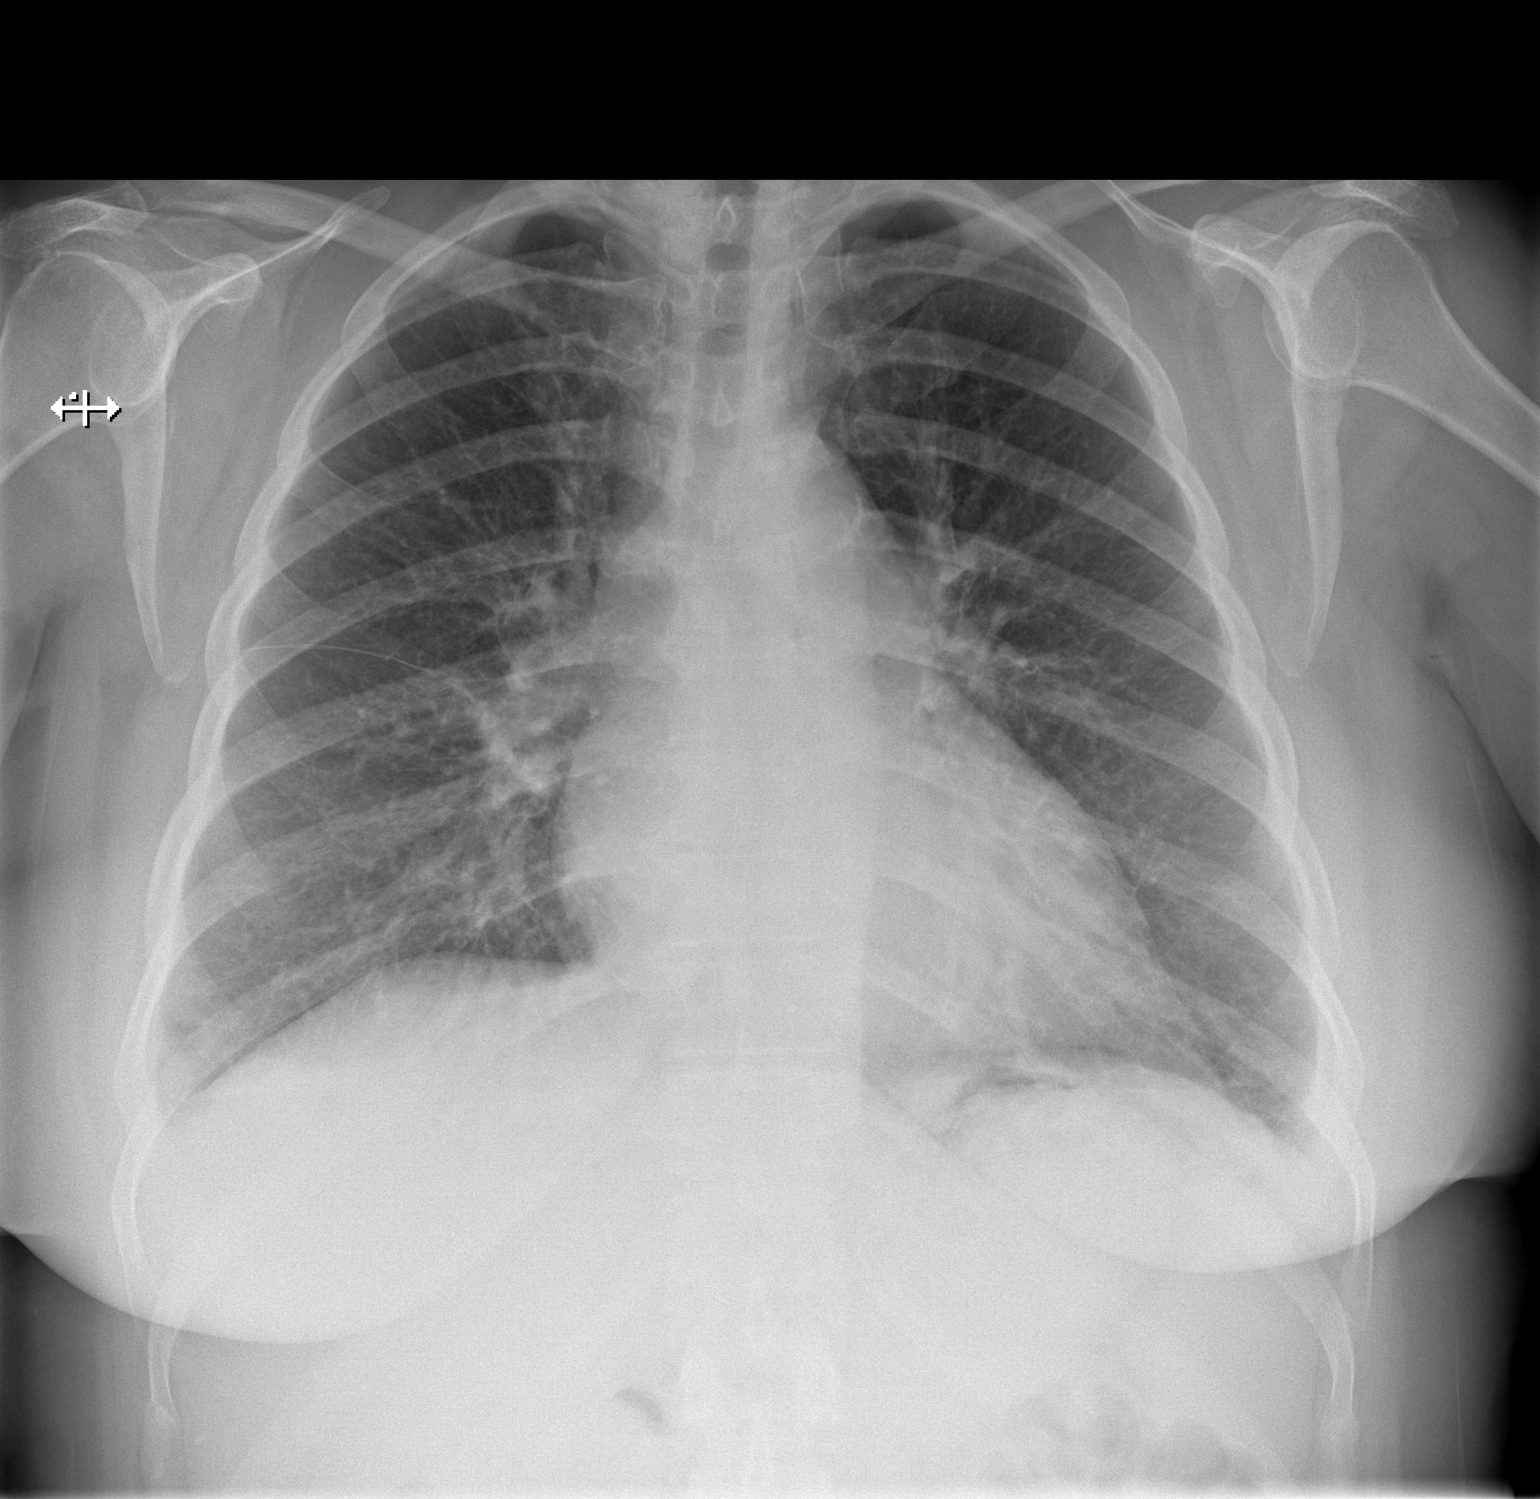

[w chest lat]
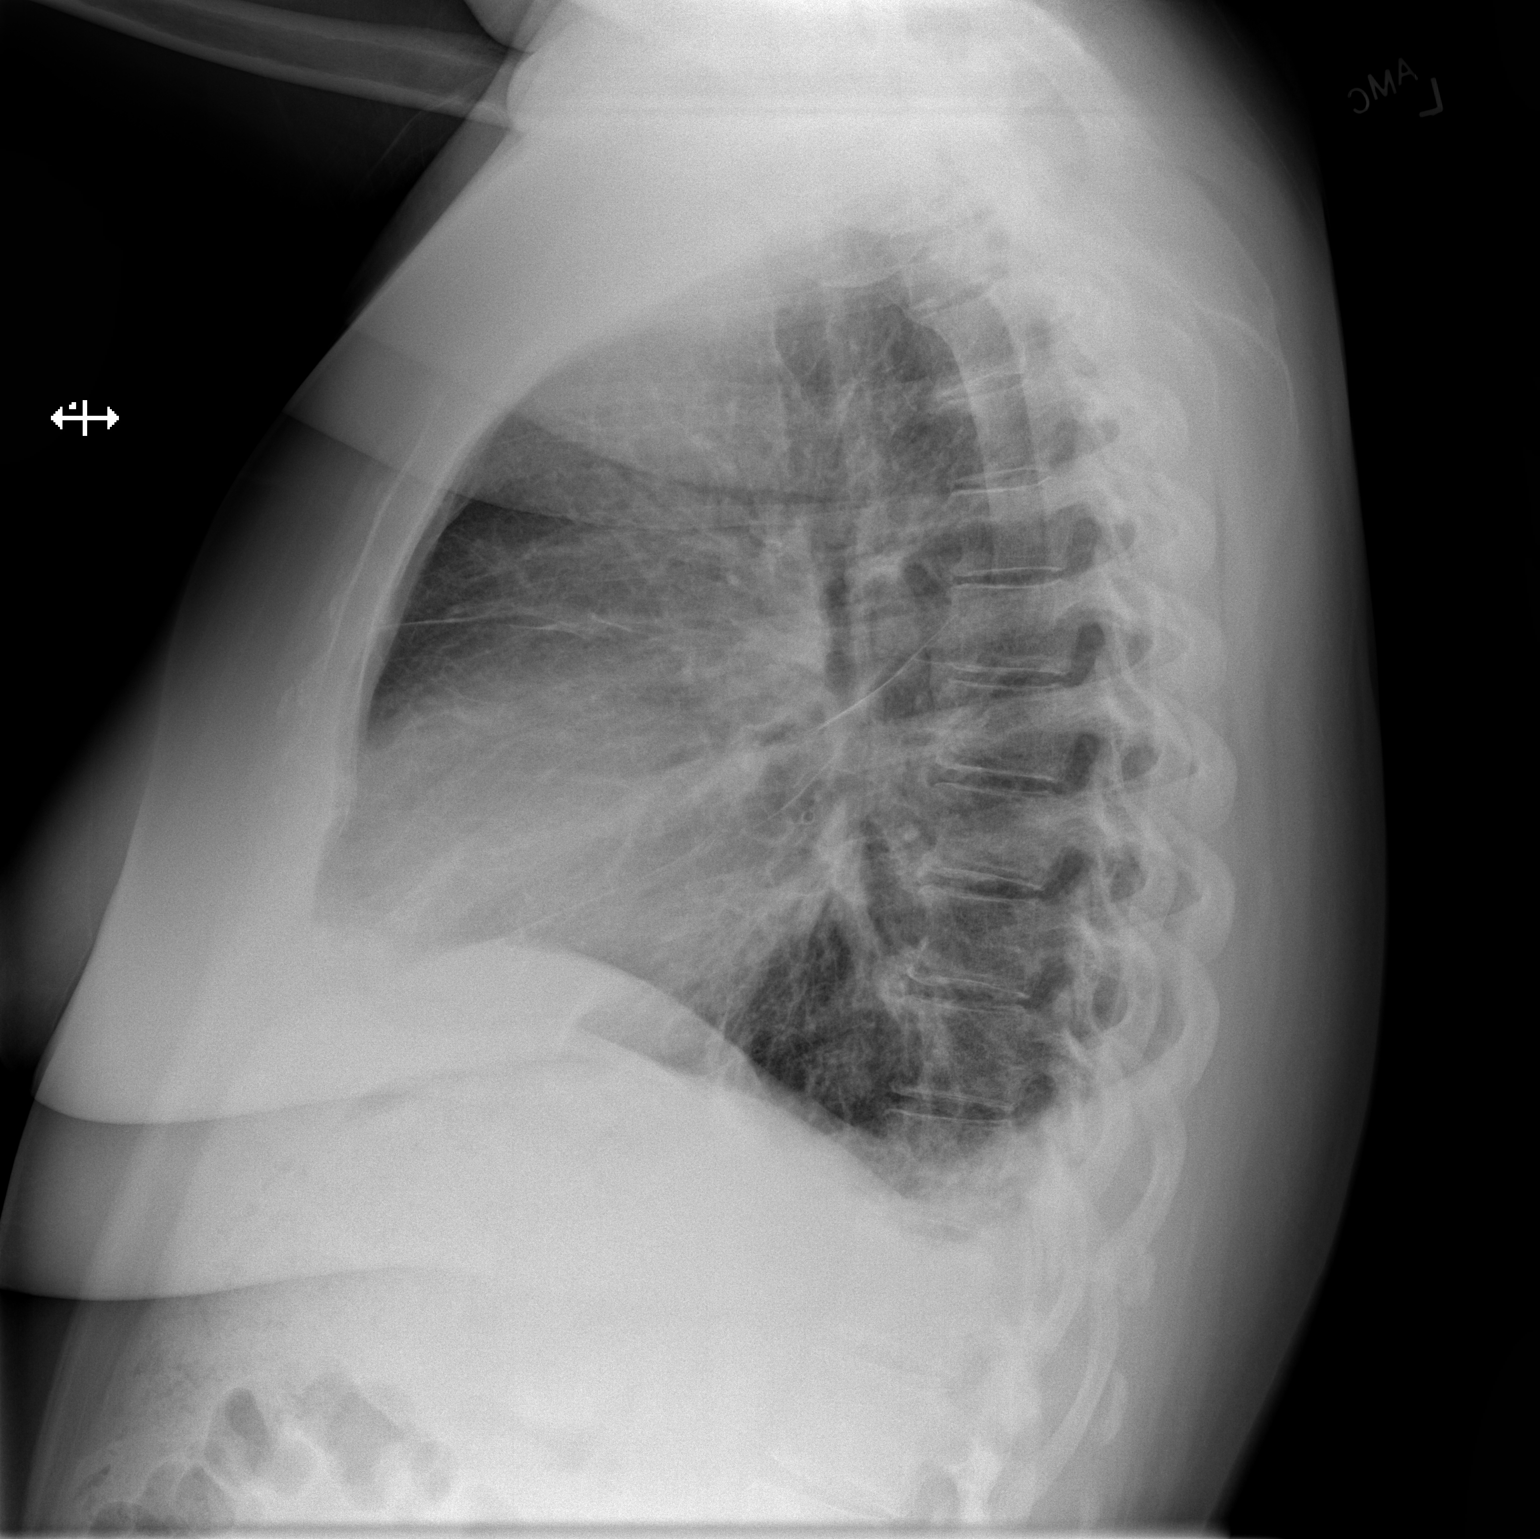

[2 of 2 positions shown; findings below may reference images not displayed]

FINDINGS: The cardiac silhouette, mediastinal and hilar contours are within
normal limits and stable. Mild age advanced calcification involving
the aorta.

Peribronchial thickening and increased interstitial markings
suggesting bronchitis or interstitial pneumonitis. No focal
infiltrates. Small bilateral pleural effusions. No pulmonary
lesions.
IMPRESSION: Bronchitis or interstitial pneumonitis but no focal infiltrates.

Small bilateral pleural effusions.

## 2024-03-20 ENCOUNTER — Emergency Department (HOSPITAL_COMMUNITY)
Admission: EM | Admit: 2024-03-20 | Discharge: 2024-03-20 | Disposition: A | Discharge status: Home / Self Care | Attending: Emergency Medicine | Admitting: Emergency Medicine

## 2024-03-20 ENCOUNTER — Other Ambulatory Visit: Payer: Self-pay

## 2024-03-20 ENCOUNTER — Encounter (HOSPITAL_COMMUNITY): Payer: Self-pay | Admitting: Emergency Medicine

## 2024-03-20 ENCOUNTER — Ambulatory Visit (HOSPITAL_COMMUNITY): Admission: EM | Admit: 2024-03-20 | Discharge: 2024-03-20 | Disposition: A | Discharge status: Home / Self Care

## 2024-03-20 ENCOUNTER — Emergency Department (HOSPITAL_COMMUNITY)

## 2024-03-20 DIAGNOSIS — R0789 Other chest pain: Secondary | ICD-10-CM | POA: Insufficient documentation

## 2024-03-20 DIAGNOSIS — R9431 Abnormal electrocardiogram [ECG] [EKG]: Secondary | ICD-10-CM | POA: Diagnosis not present

## 2024-03-20 DIAGNOSIS — E119 Type 2 diabetes mellitus without complications: Secondary | ICD-10-CM | POA: Insufficient documentation

## 2024-03-20 DIAGNOSIS — Z79899 Other long term (current) drug therapy: Secondary | ICD-10-CM | POA: Insufficient documentation

## 2024-03-20 DIAGNOSIS — I10 Essential (primary) hypertension: Secondary | ICD-10-CM | POA: Insufficient documentation

## 2024-03-20 DIAGNOSIS — R079 Chest pain, unspecified: Secondary | ICD-10-CM | POA: Diagnosis not present

## 2024-03-20 DIAGNOSIS — Z7984 Long term (current) use of oral hypoglycemic drugs: Secondary | ICD-10-CM | POA: Diagnosis not present

## 2024-03-20 DIAGNOSIS — K0889 Other specified disorders of teeth and supporting structures: Secondary | ICD-10-CM | POA: Diagnosis not present

## 2024-03-20 LAB — BASIC METABOLIC PANEL WITH GFR
Anion gap: 12 (ref 5–15)
BUN: 16 mg/dL (ref 6–20)
CO2: 24 mmol/L (ref 22–32)
Calcium: 9.2 mg/dL (ref 8.9–10.3)
Chloride: 101 mmol/L (ref 98–111)
Creatinine, Ser: 1.34 mg/dL — ABNORMAL HIGH (ref 0.44–1.00)
GFR, Estimated: 47 mL/min — ABNORMAL LOW (ref >60)
Glucose, Bld: 103 mg/dL — ABNORMAL HIGH (ref 70–99)
Potassium: 3.9 mmol/L (ref 3.5–5.1)
Sodium: 137 mmol/L (ref 135–145)

## 2024-03-20 LAB — CBC
HCT: 31.8 % — ABNORMAL LOW (ref 36.0–46.0)
Hemoglobin: 10.9 g/dL — ABNORMAL LOW (ref 12.0–15.0)
MCH: 30.4 pg (ref 26.0–34.0)
MCHC: 34.3 g/dL (ref 30.0–36.0)
MCV: 88.8 fL (ref 80.0–100.0)
Platelets: 211 K/uL (ref 150–400)
RBC: 3.58 MIL/uL — ABNORMAL LOW (ref 3.87–5.11)
RDW: 11.9 % (ref 11.5–15.5)
WBC: 9 K/uL (ref 4.0–10.5)
nRBC: 0 % (ref 0.0–0.2)

## 2024-03-20 LAB — TROPONIN I (HIGH SENSITIVITY)
Troponin I (High Sensitivity): 6 ng/L (ref <18)
Troponin I (High Sensitivity): 5 ng/L (ref <18)

## 2024-03-20 MED ORDER — OXYCODONE HCL 5 MG PO TABS: 5.0000 mg | ORAL_TABLET | Freq: Four times a day (QID) | ORAL | 0 refills | Status: DC | PRN

## 2024-03-20 MED ORDER — OXYCODONE HCL 5 MG PO TABS
5.0000 mg | ORAL_TABLET | Freq: Once | ORAL | Status: DC
  Filled 2024-03-20: qty 1

## 2024-03-20 MED ORDER — ONDANSETRON 4 MG PO TBDP
4.0000 mg | ORAL_TABLET | Freq: Once | ORAL | Status: DC
  Filled 2024-03-20: qty 1

## 2024-03-20 MED ORDER — OXYCODONE HCL 5 MG PO TABS: 5.0000 mg | ORAL_TABLET | Freq: Four times a day (QID) | ORAL | 0 refills | Status: AC | PRN

## 2024-03-20 MED ORDER — ACETAMINOPHEN 500 MG PO TABS
1000.0000 mg | ORAL_TABLET | Freq: Once | ORAL | Status: AC
  Administered 2024-03-20: 1000 mg via ORAL
  Filled 2024-03-20: qty 2

## 2024-03-20 NOTE — ED Provider Notes (Signed)
 [place] Provider Note   CSN: [id number] Arrival date & time: 03/20/24  8158     Patient presents with: Chest Pain   Michele Shaw is a 58 y.o. female who presents to the emergency department with a chief complaint of chest pain as well as dental pain.  Patient was evaluated earlier today at [facility] urgent care for chest pain and recommended to come to the emergency department.  Patient has had intermittent left-sided chest pain with some radiation to her left arm since yesterday.  She also appreciates right sided jaw pain stating that her right jaw started hurting yesterday and that she has known dental problems.  She describes the left-sided chest pain as a dull.  She states that she was not exerting herself when the chest pain began and states that her jaw pain actually started about the same time as her chest pain and is wondering if the two are connected. States that the episodes of chest pain last approximately 1 minute in duration. Denies diaphoresis.  Denies shortness of breath.  Denies nausea or vomiting.  Patient denies significant cardiac history however states that heart problems do run in her family.  Past medical history significant for hypertension, anxiety, diabetes, GERD, hyperlipidemia, etc. Patient states that she is prescribed blood pressure medication but did not take it today.    Chest Pain      Prior to Admission medications   Medication Sig Start Date End Date Taking? Authorizing Provider  oxyCODONE  (ROXICODONE ) 5 MG immediate release tablet Take 1 tablet (5 mg total) by mouth every 6 (six) hours as needed for breakthrough pain. 03/20/24  Yes Benita Boonstra F, PA-C  acyclovir (ZOVIRAX) 400 MG tablet Take 400 mg by mouth 2 (two) times daily. 12/25/18   [provider]  azelastine (OPTIVAR) 0.05 % ophthalmic solution  02/12/20   [provider]  lisinopril  (ZESTRIL ) 10 MG tablet Take 10 mg by mouth daily.     [provider]  metFORMIN  (GLUCOPHAGE ) 500 MG tablet Take by mouth. 11/30/15   [provider]  omeprazole  (PRILOSEC) 20 MG capsule Take 1 capsule (20 mg total) by mouth daily. 08/16/15   Haze Lonni PARAS, MD  omeprazole  (PRILOSEC) 40 MG capsule Take 40 mg by mouth daily. 12/03/20   [provider]  PAZEO 0.7 % SOLN INSTILL 1 DROP INTO EACH EYE IN THE MORNING 01/17/19   [provider]  RELION PEN NEEDLES 31G X 6 MM MISC USE 1 TWICE DAILY 01/29/20   [provider]  RESTASIS 0.05 % ophthalmic emulsion 1 drop 2 (two) times daily. 01/16/20   [provider]  Semaglutide (RYBELSUS PO) Take by mouth.    [provider]  sertraline  (ZOLOFT ) 100 MG tablet Take 150 mg by mouth daily. 02/04/20   [provider]  sertraline  (ZOLOFT ) 50 MG tablet Take 3 tablets (150 mg total) by mouth daily. 08/16/15   Haze Lonni PARAS, MD  simvastatin (ZOCOR) 10 MG tablet Take 10 mg by mouth daily.    [provider]  simvastatin (ZOCOR) 20 MG tablet TAKE 1 TABLET BY MOUTH ONCE DAILY IN THE EVENING FOR 90 DAYS 01/14/19   [provider]    Allergies: Aspirin    Review of Systems  Cardiovascular:  Positive for chest pain.    Updated Vital Signs BP (!) 167/85   Pulse 95   Temp 99 F (37.2 C)   Resp 16   SpO2 99%  Physical Exam Vitals and nursing note reviewed.  Constitutional:      General: She is awake. She is not in acute distress.    Appearance: Normal appearance. She is well-developed. She is not ill-appearing, toxic-appearing or diaphoretic.  HENT:     Head: Normocephalic and atraumatic.  Cardiovascular:     Rate and Rhythm: Normal rate and regular rhythm.  Pulmonary:     Effort: Pulmonary effort is normal. No tachypnea, accessory muscle usage or respiratory distress.     Breath sounds: Normal breath sounds. No decreased breath sounds, wheezing, rhonchi or rales.  Chest:     Chest wall: No tenderness.   Musculoskeletal:        General: Normal range of motion.     Right lower leg: No edema.     Left lower leg: No edema.  Skin:    General: Skin is warm.     Capillary Refill: Capillary refill takes less than 2 seconds.  Neurological:     General: No focal deficit present.     Mental Status: She is alert and oriented to person, place, and time.  Psychiatric:        Mood and Affect: Mood normal.        Behavior: Behavior normal. Behavior is cooperative.     (all labs ordered are listed, but only abnormal results are displayed) Labs Reviewed  BASIC METABOLIC PANEL WITH GFR - Abnormal; Notable for the following components:      Result Value   Glucose, Bld 103 (*)    Creatinine, Ser 1.34 (*)    GFR, Estimated 47 (*)    All other components within normal limits  CBC - Abnormal; Notable for the following components:   RBC 3.58 (*)    Hemoglobin 10.9 (*)    HCT 31.8 (*)    All other components within normal limits  TROPONIN I (HIGH SENSITIVITY)  TROPONIN I (HIGH SENSITIVITY)    EKG: EKG Interpretation Date/Time:  Monday March 20 2024 20:13:29 EDT Ventricular Rate:  95 PR Interval:  175 QRS Duration:  100 QT Interval:  362 QTC Calculation: 456 R Axis:   12  Text Interpretation: Sinus rhythm Left ventricular hypertrophy No significant change since last tracing Confirmed by Doretha Folks ([id number]) on 03/20/2024 9:46:19 PM  Radiology: [facility] Chest 2 View Result Date: 03/20/2024 CLINICAL DATA:  Chest pain EXAM: CHEST - 2 VIEW COMPARISON:  05/18/2021 FINDINGS: Cardiac shadows within normal limits. Aortic calcifications are seen. The lungs are well aerated bilaterally. No focal infiltrate or effusion is seen. No bony abnormality is noted. IMPRESSION: No active cardiopulmonary disease. Electronically Signed   By: Oneil Devonshire M.D.   On: 03/20/2024 20:13     Procedures   Medications Ordered in the ED  acetaminophen  (TYLENOL ) tablet 1,000 mg (1,000 mg Oral Given 03/20/24 2132)                                     Medical Decision Making Amount and/or Complexity of Data Reviewed Labs: ordered. Radiology: ordered.  Risk OTC drugs. Prescription drug management.   Patient presents to the ED for concern of chest pain, dental pain, this involves an extensive number of treatment options, and is a complaint that carries with it a high risk of complications and morbidity.  The differential diagnosis includes ACS, pneumothorax, pericarditis, aortic dissection, pulmonary embolism, Ludwig's angina, peritonsillar abscess, dental infection, dental pain, etc.  Co morbidities that complicate the patient evaluation  hypertension, anxiety, diabetes, GERD, hyperlipidemia   Additional history obtained:  Additional history obtained from urgent care note from earlier today   Lab Tests:  I Ordered, and personally interpreted labs.  The pertinent results include: CBC shows hemoglobin of 10.9, BMP shows creatinine 1.34 which is similar to patient baseline, troponins flat at 6 and 5   Imaging Studies ordered:  I ordered imaging studies including chest x-ray I independently visualized and interpreted imaging which showed no active cardiopulmonary disease, no obvious abnormality of heart or lungs I agree with the radiologist interpretation   Cardiac Monitoring:  The patient was maintained on a cardiac monitor.  I personally viewed and interpreted the cardiac monitored which showed an underlying rhythm of: Sinus rhythm   Medicines ordered and prescription drug management:  I ordered medication including Tylenol  for dental pain Reevaluation of the patient after these medicines showed that the patient improved I have reviewed the patients home medicines and have made adjustments as needed   Test Considered:  None   Critical Interventions:  None   Problem List / ED Course:  58 year old female, presented to the emergency department for chief complaint of chest  pain, seen urgent care earlier today and told to come to the emergency department, denies significant cardiac history but does run in her family Vital signs significant for elevated blood pressure 176/84 however patient is diagnosed hypertensive and has not taken her medications today On physical exam patient overall well-appearing, chest pain nonreproducible, no obvious shortness of breath, no obvious abnormalities with auscultation of heart or lungs, EKG does not show any ST elevation or ischemic changes based off of my interpretation, chest pain described as non-exertional Lab workup overall reassuring, hemoglobin 10.9 patient states that she is on iron and known to be anemic at baseline, creatinine of 1.34 seems consistent with baseline as well, troponins 6 and 5 Chest x-ray shows no obvious abnormality of heart or lungs Patient given Tylenol  for tooth pain, patient currently denies chest pain  Overall reassuring lab workup today, troponins flat, no abnormality on chest x-ray, no acute ST segment elevation or notable ischemic changes on EKG by my interpretation Return precautions given Patient discharged with a few doses of narcotic pain medication for tooth, patient planning on seeing dentist tomorrow, I see no indication for antibiotics at this time, patient does not have any neck or face swelling, sign of abscess, uvular deviation, sign of peritonsillar abscess, fever, chills Recommend follow-up with primary care provider Heart score of 4 points due to EKG with possible LVH criteria, age, risk factors however troponin not elevated     Reevaluation:  After the interventions noted above, I reevaluated the patient and found that they have :improved   Social Determinants of Health:  none   Dispostion:  After consideration of the diagnostic results and the patients response to treatment, I feel that the patient would benefit from discharge and outpatient therapy as described     Final  diagnoses:  Chest pain, unspecified type  Pain, dental    ED Discharge Orders          Ordered    oxyCODONE  (ROXICODONE ) 5 MG immediate release tablet  Every 6 hours PRN        03/20/24 2229               Janetta Terrall FALCON, PA-C 03/20/24 2236    Doretha Folks, MD 03/20/24 2302

## 2024-03-20 NOTE — ED Triage Notes (Signed)
 Chest pain onset 2 days ago, right side of jaw started hurting yesterday.    Left chest pain, intermittent, slight radiation of dull pain in left arm.  No diaphoresis.  Has felt feverish.  Denies nausea or vomiting.  Left chest pain is described as dull , then sharp  Has taken tylenol .

## 2024-03-20 NOTE — Discharge Instructions (Addendum)
 It was a pleasure taking care of you today.  Based on your history, physical exam, labs, and imaging I feel you are safe for discharge.  Today there was no evidence that your chest pain is cardiac in nature however please continue to monitor your symptoms.  Please take the prescribed pain medication for breakthrough pain for your dental pain, please follow-up with a dentist as soon as possible, preferably tomorrow.  Please continue to take over-the-counter Tylenol  for pain, the max daily dose of Tylenol  is 4000 mg/day, please do not exceed this daily limit.  Please return to the emergency department or seek further medical evaluation if you experience any of the following symptoms including but not limited to fever, chills, worsening tooth pain, face swelling, neck swelling, issues breathing, issues swallowing, chest pain, shortness of breath, nausea, or other concerning symptom.  Recommend follow-up with your primary care provider if symptoms persist or worsen within 48 hours. Please do not drive or operate heavy machinery after taking the narcotic pain medication.

## 2024-03-20 NOTE — ED Triage Notes (Addendum)
 Pt sent here from UC with c/o chest pain and an abnormal EKG ,cp has been off and on since sat ,  pt does have a family history of heart problems

## 2024-03-20 NOTE — ED Provider Notes (Signed)
 [facility]    CSN: [id number] Arrival date & time: 03/20/24  1706      History   Chief Complaint Chief Complaint  Patient presents with   Dental Pain   Chest Pain    HPI Michele Shaw is a 58 y.o. female  with hx of DM and HTN presents with intermittent L chest pain with slight radiation of dull pain on her L arm since yesterday. Her R jaw started hurting yesterday due to dental problems. The L chest pain is described as dull. She has not been sweating. Has felt feverish, but denies N/V.     Past Medical History:  Diagnosis Date   Anxiety    Diabetes mellitus without complication (HCC)    GERD (gastroesophageal reflux disease)    Hypertension     Patient Active Problem List   Diagnosis Date Noted   Syncope 08/16/2015    Past Surgical History:  Procedure Laterality Date   carpel tunnel     CESAREAN SECTION      OB History   No obstetric history on file.      Home Medications    Prior to Admission medications   Medication Sig Start Date End Date Taking? Authorizing Provider  Semaglutide (RYBELSUS PO) Take by mouth.   Yes [provider]  acyclovir (ZOVIRAX) 400 MG tablet Take 400 mg by mouth 2 (two) times daily. 12/25/18   [provider]  azelastine (OPTIVAR) 0.05 % ophthalmic solution  02/12/20   [provider]  lisinopril  (ZESTRIL ) 10 MG tablet Take 10 mg by mouth daily.    [provider]  metFORMIN  (GLUCOPHAGE ) 500 MG tablet Take by mouth. 11/30/15   [provider]  omeprazole  (PRILOSEC) 20 MG capsule Take 1 capsule (20 mg total) by mouth daily. 08/16/15   Haze Lonni PARAS, MD  omeprazole  (PRILOSEC) 40 MG capsule Take 40 mg by mouth daily. 12/03/20   [provider]  PAZEO 0.7 % SOLN INSTILL 1 DROP INTO EACH EYE IN THE MORNING 01/17/19   [provider]  RELION PEN NEEDLES 31G X 6 MM MISC USE 1 TWICE DAILY 01/29/20   [provider]  RESTASIS 0.05 % ophthalmic emulsion 1  drop 2 (two) times daily. 01/16/20   [provider]  sertraline  (ZOLOFT ) 100 MG tablet Take 150 mg by mouth daily. 02/04/20   [provider]  sertraline  (ZOLOFT ) 50 MG tablet Take 3 tablets (150 mg total) by mouth daily. 08/16/15   Haze Lonni PARAS, MD  simvastatin (ZOCOR) 10 MG tablet Take 10 mg by mouth daily.    [provider]  simvastatin (ZOCOR) 20 MG tablet TAKE 1 TABLET BY MOUTH ONCE DAILY IN THE EVENING FOR 90 DAYS 01/14/19   [provider]    Family History Family History  Problem Relation Age of Onset   Hypertension Mother    Hypertension Father     Social History Social History   Tobacco Use   Smoking status: Never   Smokeless tobacco: Never  Vaping Use   Vaping status: Never Used  Substance Use Topics   Alcohol use: Not Currently   Drug use: No     Allergies   Aspirin   Review of Systems Review of Systems  As noted in HPI  Physical Exam Triage Vital Signs ED Triage Vitals  Encounter Vitals Group     BP 03/20/24 1756 (!) 170/84     Girls Systolic BP Percentile --      Girls  Diastolic BP Percentile --      Boys Systolic BP Percentile --      Boys Diastolic BP Percentile --      Pulse Rate 03/20/24 1756 95     Resp 03/20/24 1756 18     Temp 03/20/24 1756 98.6 F (37 C)     Temp Source 03/20/24 1756 Oral     SpO2 03/20/24 1756 96 %     Weight --      Height --      Head Circumference --      Peak Flow --      Pain Score 03/20/24 1751 8     Pain Loc --      Pain Education --      Exclude from Growth Chart --    No data found.  Updated Vital Signs BP (!) 163/83 (BP Location: Left Arm)   Pulse 95   Temp 98.6 F (37 C) (Oral)   Resp 18   SpO2 96%   Visual Acuity Right Eye Distance:   Left Eye Distance:   Bilateral Distance:    Right Eye Near:   Left Eye Near:    Bilateral Near:     Physical Exam Vitals and nursing note reviewed.  Constitutional:      General: She is not in acute  distress.    Appearance: She is obese. She is not toxic-appearing or diaphoretic.  Eyes:     General: No scleral icterus.    Conjunctiva/sclera: Conjunctivae normal.  Cardiovascular:     Rate and Rhythm: Normal rate and regular rhythm.     Heart sounds: No murmur heard. Pulmonary:     Effort: Pulmonary effort is normal.     Breath sounds: Normal breath sounds.  Musculoskeletal:        General: Normal range of motion.     Cervical back: Neck supple.  Skin:    General: Skin is warm and dry.  Neurological:     Mental Status: She is alert and oriented to person, place, and time.     Gait: Gait normal.  Psychiatric:        Mood and Affect: Mood normal.        Behavior: Behavior normal.        Thought Content: Thought content normal.        Judgment: Judgment normal.      UC Treatments / Results  Labs (all labs ordered are listed, but only abnormal results are displayed) Labs Reviewed - No data to display  EKG NSR with non specific T wave abnormality which is a change when compared with EKG from 2020.   Radiology No results found.  Procedures Procedures (including critical care time)  Medications Ordered in UC Medications - No data to display  Initial Impression / Assessment and Plan / UC Course  I have reviewed the triage vital signs and the nursing notes.  Pertinent  imaging results that were available during my care of the patient were reviewed by me and considered in my medical decision making (see chart for details).  Chest pain with abnormal EKG and HTN  Sent to ER for further work up via personal vehicle since she declined to go via ambulance.    Final Clinical Impressions(s) / UC Diagnoses   Final diagnoses:  Chest pain, unspecified type  Nonspecific abnormal electrocardiogram (ECG) (EKG)     Discharge Instructions      You need to be seen in he ER right now for  more test than what we can do right now      ED Prescriptions   None    PDMP  not reviewed this encounter.   Lindi Carter, PA-C 03/20/24 [phone]

## 2024-03-20 NOTE — Discharge Instructions (Signed)
 You need to be seen in he ER right now for more test than what we can do right now

## 2024-03-20 NOTE — ED Notes (Signed)
  Pt teaching provided on medications that may cause drowsiness. Pt instructed not to drive or operate heavy machinery while taking the prescribed medication. Pt verbalized understanding.   Pt provided discharge instructions and prescription information. Pt was given the opportunity to ask questions and questions were answered.

## 2024-08-01 ENCOUNTER — Other Ambulatory Visit: Payer: Self-pay

## 2024-08-01 ENCOUNTER — Emergency Department (HOSPITAL_BASED_OUTPATIENT_CLINIC_OR_DEPARTMENT_OTHER)
Admission: EM | Admit: 2024-08-01 | Discharge: 2024-08-01 | Disposition: A | Discharge status: Home / Self Care | Attending: Emergency Medicine | Admitting: Emergency Medicine

## 2024-08-01 ENCOUNTER — Emergency Department (HOSPITAL_BASED_OUTPATIENT_CLINIC_OR_DEPARTMENT_OTHER)

## 2024-08-01 ENCOUNTER — Encounter (HOSPITAL_BASED_OUTPATIENT_CLINIC_OR_DEPARTMENT_OTHER): Payer: Self-pay

## 2024-08-01 DIAGNOSIS — E119 Type 2 diabetes mellitus without complications: Secondary | ICD-10-CM | POA: Insufficient documentation

## 2024-08-01 DIAGNOSIS — Z7984 Long term (current) use of oral hypoglycemic drugs: Secondary | ICD-10-CM | POA: Insufficient documentation

## 2024-08-01 DIAGNOSIS — Z79899 Other long term (current) drug therapy: Secondary | ICD-10-CM | POA: Diagnosis not present

## 2024-08-01 DIAGNOSIS — I10 Essential (primary) hypertension: Secondary | ICD-10-CM | POA: Diagnosis not present

## 2024-08-01 DIAGNOSIS — M79602 Pain in left arm: Secondary | ICD-10-CM | POA: Insufficient documentation

## 2024-08-01 DIAGNOSIS — R079 Chest pain, unspecified: Secondary | ICD-10-CM | POA: Diagnosis present

## 2024-08-01 LAB — BASIC METABOLIC PANEL WITH GFR
Anion gap: 11 (ref 5–15)
BUN: 19 mg/dL (ref 6–20)
CO2: 24 mmol/L (ref 22–32)
Calcium: 9.3 mg/dL (ref 8.9–10.3)
Chloride: 104 mmol/L (ref 98–111)
Creatinine, Ser: 1.34 mg/dL — ABNORMAL HIGH (ref 0.44–1.00)
GFR, Estimated: 46 mL/min — ABNORMAL LOW
Glucose, Bld: 84 mg/dL (ref 70–99)
Potassium: 4.7 mmol/L (ref 3.5–5.1)
Sodium: 139 mmol/L (ref 135–145)

## 2024-08-01 LAB — TROPONIN T, HIGH SENSITIVITY
Troponin T High Sensitivity: <15 ng/L (ref 0–19)
Troponin T High Sensitivity: <15 ng/L (ref 0–19)

## 2024-08-01 LAB — CBC
HCT: 30.8 % — ABNORMAL LOW (ref 36.0–46.0)
Hemoglobin: 10.7 g/dL — ABNORMAL LOW (ref 12.0–15.0)
MCH: 30.5 pg (ref 26.0–34.0)
MCHC: 34.7 g/dL (ref 30.0–36.0)
MCV: 87.7 fL (ref 80.0–100.0)
Platelets: 188 K/uL (ref 150–400)
RBC: 3.51 MIL/uL — ABNORMAL LOW (ref 3.87–5.11)
RDW: 12 % (ref 11.5–15.5)
WBC: 6 K/uL (ref 4.0–10.5)
nRBC: 0 % (ref 0.0–0.2)

## 2024-08-01 MED ORDER — ACETAMINOPHEN 500 MG PO TABS
1000.0000 mg | ORAL_TABLET | Freq: Once | ORAL | Status: AC
  Administered 2024-08-01: 1000 mg via ORAL
  Filled 2024-08-01: qty 2

## 2024-08-01 NOTE — ED Notes (Signed)
 ED Provider at bedside.

## 2024-08-01 NOTE — Discharge Instructions (Addendum)
 Imaging lab work and exam are reassuring today.  It is recommended to follow-up with your primary care this week or next for further evaluation and get established with Cardiology.  It is important to monitor for any worsening symptoms including chest pain that does not go away or worsens in severity, shortness of breath that is persistent, complete numbness or loss of function in your arm, dizziness, fainting, or any other concerning symptoms.  If any of these or other concerning symptoms arise please return to the emergency department for further evaluation.

## 2024-08-01 NOTE — ED Triage Notes (Signed)
 Intermittent L sided chest pain and L arm pain for 1 week.  Denies SHOB, N/V

## 2024-08-01 NOTE — ED Provider Notes (Signed)
 " [place] [facility] Provider Note   CSN: [id number] Arrival date & time: 08/01/24  1153     Patient presents with: Chest Pain   [place] Bryngelson is a 59 y.o. female.  59 year old female presents emergency department with complaints of chest pain and left arm pain x 1 week.  Patient has significant history of diabetes, hypertension, GERD, anxiety.  Patient reports she was washing dishes about a week ago when she noticed a sharp pain in her chest and a pain in her left arm.  She reports it comes and goes and she does not really notices it whenever she is going about her day.  She reports when she lays down on her left side it hurts worse and when she sleeps on her left side she will wake up and having numbness in her left arm as well.  The numbness will go away shortly after waking along with the chest pain.  She reports the pain has 6 out of 10 when out occurs.  She has tried Tylenol  she does not know if it really helped with the pain or not.  She currently denies any current pain but was concerned because the pain has been on and off for about a week.  Patient denies any nausea, vomiting, dizziness, headache, syncope, abdominal pain, diaphoresis, diarrhea, constipation, leg swelling.     Prior to Admission medications  Medication Sig Start Date End Date Taking? Authorizing Provider  acyclovir (ZOVIRAX) 400 MG tablet Take 400 mg by mouth 2 (two) times daily. 12/25/18   [provider]  azelastine (OPTIVAR) 0.05 % ophthalmic solution  02/12/20   [provider]  lisinopril  (ZESTRIL ) 10 MG tablet Take 10 mg by mouth daily.    [provider]  metFORMIN  (GLUCOPHAGE ) 500 MG tablet Take by mouth. 11/30/15   [provider]  omeprazole  (PRILOSEC) 20 MG capsule Take 1 capsule (20 mg total) by mouth daily. 08/16/15   Haze Lonni PARAS, MD  omeprazole  (PRILOSEC) 40 MG capsule Take 40 mg by mouth daily. 12/03/20   [provider]  oxyCODONE  (ROXICODONE ) 5 MG immediate release tablet Take 1 tablet (5 mg total) by mouth every 6 (six) hours as needed for breakthrough pain. 03/20/24   Hinnant, Collin F, PA-C  PAZEO 0.7 % SOLN INSTILL 1 DROP INTO EACH EYE IN THE MORNING 01/17/19   [provider]  RELION PEN NEEDLES 31G X 6 MM MISC USE 1 TWICE DAILY 01/29/20   [provider]  RESTASIS 0.05 % ophthalmic emulsion 1 drop 2 (two) times daily. 01/16/20   [provider]  Semaglutide (RYBELSUS PO) Take by mouth.    [provider]  sertraline  (ZOLOFT ) 100 MG tablet Take 150 mg by mouth daily. 02/04/20   [provider]  sertraline  (ZOLOFT ) 50 MG tablet Take 3 tablets (150 mg total) by mouth daily. 08/16/15   Haze Lonni PARAS, MD  simvastatin (ZOCOR) 10 MG tablet Take 10 mg by mouth daily.    [provider]  simvastatin (ZOCOR) 20 MG tablet TAKE 1 TABLET BY MOUTH ONCE DAILY IN THE EVENING FOR 90 DAYS 01/14/19   [provider]    Allergies: Aspirin    Review of Systems  Cardiovascular:  Positive for chest pain.  Musculoskeletal:  Positive for myalgias.  Neurological:  Positive for numbness.  All other systems reviewed and are negative.   Updated Vital Signs BP 106/82   Pulse 81   Temp 98.2 F (  36.8 C) (Oral)   Resp 17   SpO2 100%   Physical Exam Vitals and nursing note reviewed.  Constitutional:      General: She is not in acute distress.    Appearance: Normal appearance. She is well-developed. She is not ill-appearing.  HENT:     Head: Normocephalic and atraumatic.     Nose: Nose normal.  Eyes:     Extraocular Movements: Extraocular movements intact.     Conjunctiva/sclera: Conjunctivae normal.     Pupils: Pupils are equal, round, and reactive to light.  Cardiovascular:     Rate and Rhythm: Normal rate and regular rhythm.     Comments: No pain to palpation to chest wall or pain with movement of arm.  Patient denies any current chest  pain. Pulmonary:     Effort: Pulmonary effort is normal. No respiratory distress.     Breath sounds: Normal breath sounds.  Abdominal:     General: Abdomen is flat.     Palpations: Abdomen is soft.     Tenderness: There is no abdominal tenderness.  Musculoskeletal:        General: Normal range of motion.     Cervical back: Normal range of motion.     Right lower leg: No tenderness. No edema.     Left lower leg: No tenderness. No edema.     Comments: No pain to palpation throughout arm patient does endorse some aching feeling on her tricep on her left arm.  She is able to actively move her arm fully without difficulty.  Skin:    General: Skin is warm.     Capillary Refill: Capillary refill takes less than 2 seconds.  Neurological:     General: No focal deficit present.     Mental Status: She is alert.  Psychiatric:        Mood and Affect: Mood normal.        Behavior: Behavior normal.     (all labs ordered are listed, but only abnormal results are displayed) Labs Reviewed  BASIC METABOLIC PANEL WITH GFR - Abnormal; Notable for the following components:      Result Value   Creatinine, Ser 1.34 (*)    GFR, Estimated 46 (*)    All other components within normal limits  CBC - Abnormal; Notable for the following components:   RBC 3.51 (*)    Hemoglobin 10.7 (*)    HCT 30.8 (*)    All other components within normal limits  TROPONIN T, HIGH SENSITIVITY  TROPONIN T, HIGH SENSITIVITY    EKG: None  Radiology: DG Chest 2 View Result Date: 08/01/2024 CLINICAL DATA:  Chest pain and headaches. EXAM: DG CHEST 2V COMPARISON:  03/20/2024 FINDINGS: The heart size and mediastinal contours are within normal limits. Both lungs are clear. The visualized skeletal structures are unremarkable. IMPRESSION: No active cardiopulmonary disease. Electronically Signed   By: Toribio Agreste M.D.   On: 08/01/2024 12:15    Procedures   Medications Ordered in the ED  acetaminophen  (TYLENOL ) tablet  1,000 mg (1,000 mg Oral Given 08/01/24 1437)    Clinical Course as of 08/01/24 1822  Tue Aug 01, 2024  1410 Creatinine(!): 1.34 Similar to prior  [SG]    Clinical Course User Index [SG] Elnor Jayson LABOR, DO   59 y.o. female presents to the ED with complaints of chest pain, arm pain, numbness to the left, The differential diagnosis includes arrhythmia, ACS, TIA, CVA, electrolyte abnormality, anemia, (Ddx)  On arrival pt  is nontoxic, vitals unremarkable. Exam significant for some achy pain in her left tricep  I ordered medication Tylenol  for pain  Lab Tests:  BMP CBC troponin ordered in triage BMP notable for creatinine at 1.3 and GFR 46 similar to patient's previous labs, patient is awaiting of decreased kidney function secondary to diabetes and hypertension. CBC mild anemia 10.7. Initial troponin negative, delta troponin negative  Imaging Studies ordered:  I ordered imaging studies which included chest x-ray, no acute abnormalities no acute abnormalities.  ED Course:   Patient sitting comfortably in ED bed no acute distress nontoxic-appearing.  Patient is not currently experiencing any pain and pain seems consistent with waking up in the morning while sleeping on her left side.  Pain is exacerbated with lying on her left side as well.  Numbness occurs consistently when waking up after sleeping on her left side.  In resolves shortly after.  Patient does not have any current numbness or chest pain.  Low suspicion for ACS.  Will obtain delta troponin.  Delta troponin is negative.  Patient is sitting comfortably with no new complaints and no active chest pain or arm numbness/pain.  I suspect intermittent pain is musculoskeletal secondary from sleeping on her left arm.  It was advised patient to follow-up with PCP in office this week.  She reports she is with [facility] medical and would prefer to follow-up with cardiology associated with them and will get the referral from her PCP.  Cardiology  referral was offered in ED and patient preferred to go with [facility] through her PCP at this time.  Patient was given strict return precautions comfortable discharge at this time.   Portions of this note were generated with Scientist, clinical (histocompatibility and immunogenetics). Dictation errors may occur despite best attempts at proofreading.   Final diagnoses:  Nonspecific chest pain    ED Discharge Orders     None          Myriam Fonda GORMAN DEVONNA 08/01/24 1823    Darra Fonda MATSU, MD 08/01/24 1916  "
